# Patient Record
Sex: Female | Born: 1970
Health system: Southern US, Community
[De-identification: ages and names within clinical notes are randomized; demographics above are authoritative.]

## PROBLEM LIST (undated history)

## (undated) DIAGNOSIS — I7300 Raynaud's syndrome without gangrene: Secondary | ICD-10-CM

## (undated) DIAGNOSIS — M5010 Cervical disc disorder with radiculopathy, unspecified cervical region: Secondary | ICD-10-CM

## (undated) DIAGNOSIS — F988 Other specified behavioral and emotional disorders with onset usually occurring in childhood and adolescence: Secondary | ICD-10-CM

## (undated) DIAGNOSIS — F32A Depression, unspecified: Secondary | ICD-10-CM

## (undated) DIAGNOSIS — R002 Palpitations: Secondary | ICD-10-CM

## (undated) DIAGNOSIS — F329 Major depressive disorder, single episode, unspecified: Secondary | ICD-10-CM

## (undated) HISTORY — DX: Other specified behavioral and emotional disorders with onset usually occurring in childhood and adolescence: F98.8

## (undated) HISTORY — DX: Raynaud's syndrome without gangrene: I73.00

## (undated) HISTORY — PX: AUGMENTATION MAMMAPLASTY: SUR837

---

## 2005-11-11 ENCOUNTER — Observation Stay: Payer: Self-pay | Admitting: Obstetrics and Gynecology

## 2005-12-08 ENCOUNTER — Inpatient Hospital Stay: Payer: Self-pay | Admitting: Obstetrics and Gynecology

## 2007-05-23 ENCOUNTER — Ambulatory Visit: Payer: Self-pay | Admitting: Obstetrics and Gynecology

## 2007-07-02 ENCOUNTER — Ambulatory Visit: Payer: Self-pay | Admitting: Obstetrics and Gynecology

## 2007-07-18 ENCOUNTER — Ambulatory Visit: Payer: Self-pay | Admitting: Unknown Physician Specialty

## 2011-07-26 ENCOUNTER — Ambulatory Visit (INDEPENDENT_AMBULATORY_CARE_PROVIDER_SITE_OTHER): Payer: 59 | Admitting: Internal Medicine

## 2011-07-26 ENCOUNTER — Encounter: Payer: Self-pay | Admitting: Internal Medicine

## 2011-07-26 DIAGNOSIS — G589 Mononeuropathy, unspecified: Secondary | ICD-10-CM

## 2011-07-26 DIAGNOSIS — R5383 Other fatigue: Secondary | ICD-10-CM

## 2011-07-26 DIAGNOSIS — Z13220 Encounter for screening for lipoid disorders: Secondary | ICD-10-CM

## 2011-07-26 DIAGNOSIS — G629 Polyneuropathy, unspecified: Secondary | ICD-10-CM

## 2011-07-26 DIAGNOSIS — I7300 Raynaud's syndrome without gangrene: Secondary | ICD-10-CM

## 2011-07-26 DIAGNOSIS — R5381 Other malaise: Secondary | ICD-10-CM

## 2011-07-26 NOTE — Progress Notes (Signed)
  Subjective:    Patient ID: Kristi Spence, female    DOB: 12/23/1970, 40 y.o.   MRN: [id number]  HPI Ms. Houchen is a 40 yr old white female who was referred by Donette Larry for primary care.  Her GYN care is up to date by Annamarie Major.  Her cc is a 2 yr  history of sudden changes in color ofher fingertips and toes , which turn white in cold weather .  She notes it more often when she enters the grocery store or opens the refrigerator door.  No history of cyanotic color changes.  She has no history of dysphagia, arthralgias, skin disorders, spontaneous abortions, or DVT/VTE.  Physically very active, works out 5 to 6 days per week. 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  No past medical history on file. No current outpatient prescriptions on file prior to visit.     Review of Systems  Constitutional: Negative for fever, chills and unexpected weight change.  HENT: Negative for hearing loss, ear pain, nosebleeds, congestion, sore throat, facial swelling, rhinorrhea, sneezing, mouth sores, trouble swallowing, neck pain, neck stiffness, voice change, postnasal drip, sinus pressure, tinnitus and ear discharge.   Eyes: Negative for pain, discharge, redness and visual disturbance.  Respiratory: Negative for cough, chest tightness, shortness of breath, wheezing and stridor.   Cardiovascular: Negative for chest pain, palpitations and leg swelling.  Musculoskeletal: Negative for myalgias and arthralgias.  Skin: Negative for color change and rash.  Neurological: Negative for dizziness, weakness, light-headedness and headaches.  Hematological: Negative for adenopathy.       Objective:   Physical Exam  Constitutional: She is oriented to person, place, and time. She appears well-developed and well-nourished.  HENT:  Mouth/Throat: Oropharynx is clear and moist.  Eyes: EOM are normal. Pupils are equal, round, and reactive to light. No scleral icterus.  Neck: Normal range of motion.  Neck supple. No JVD present. No thyromegaly present.  Cardiovascular: Normal rate, regular rhythm, normal heart sounds and intact distal pulses.        Cap refill 3 secs on toes.  Pulses intact  Pulmonary/Chest: Effort normal and breath sounds normal.  Abdominal: Soft. Bowel sounds are normal. She exhibits no mass. There is no tenderness.  Musculoskeletal: Normal range of motion. She exhibits no edema.  Lymphadenopathy:    She has no cervical adenopathy.  Neurological: She is alert and oriented to person, place, and time.  Skin: Skin is warm and dry.  Psychiatric: She has a normal mood and affect.          Assessment & Plan:   Raynaud phenomenon New diagnosis, with no associated symptoms concernign for connective tissue or autoimmune disease.  Her father and brother also have the same symptoms.  Patient information given about the syndrome,  Reassurance provided.   Screening for lipoid disorders She will return for fasting lipids.    Updated Medication List No outpatient encounter prescriptions on file as of 07/26/2011.

## 2011-07-27 ENCOUNTER — Encounter: Payer: Self-pay | Admitting: Internal Medicine

## 2011-07-27 DIAGNOSIS — Z13220 Encounter for screening for lipoid disorders: Secondary | ICD-10-CM | POA: Insufficient documentation

## 2011-07-27 NOTE — Assessment & Plan Note (Signed)
She will return for fasting lipids 

## 2011-07-27 NOTE — Assessment & Plan Note (Signed)
New diagnosis, with no associated symptoms concernign for connective tissue or autoimmune disease.  Her father and brother also have the same symptoms.  Patient information given about the syndrome,  Reassurance provided.

## 2011-07-28 ENCOUNTER — Other Ambulatory Visit (INDEPENDENT_AMBULATORY_CARE_PROVIDER_SITE_OTHER): Payer: 59 | Admitting: *Deleted

## 2011-07-28 DIAGNOSIS — R5381 Other malaise: Secondary | ICD-10-CM

## 2011-07-28 DIAGNOSIS — G589 Mononeuropathy, unspecified: Secondary | ICD-10-CM

## 2011-07-28 DIAGNOSIS — G629 Polyneuropathy, unspecified: Secondary | ICD-10-CM

## 2011-07-28 DIAGNOSIS — Z13220 Encounter for screening for lipoid disorders: Secondary | ICD-10-CM

## 2011-07-28 DIAGNOSIS — R5383 Other fatigue: Secondary | ICD-10-CM

## 2011-07-28 LAB — LIPID PANEL
Cholesterol: 180 mg/dL (ref 0–200)
HDL: 68.6 mg/dL (ref >39.00)
LDL Cholesterol: 103 mg/dL — ABNORMAL HIGH (ref 0–99)
Triglycerides: 40 mg/dL (ref 0.0–149.0)
VLDL: 8 mg/dL (ref 0.0–40.0)

## 2011-07-28 LAB — COMPREHENSIVE METABOLIC PANEL
AST: 34 U/L (ref 0–37)
BUN: 12 mg/dL (ref 6–23)
Calcium: 8.8 mg/dL (ref 8.4–10.5)
Chloride: 105 mEq/L (ref 96–112)
Creatinine, Ser: 0.8 mg/dL (ref 0.4–1.2)

## 2011-07-28 LAB — CBC WITH DIFFERENTIAL/PLATELET
Eosinophils Relative: 1.8 % (ref 0.0–5.0)
HCT: 40 % (ref 36.0–46.0)
Lymphs Abs: 1.7 10*3/uL (ref 0.7–4.0)
Monocytes Relative: 12.4 % — ABNORMAL HIGH (ref 3.0–12.0)
Neutrophils Relative %: 46.3 % (ref 43.0–77.0)
Platelets: 160 10*3/uL (ref 150.0–400.0)
WBC: 4.3 10*3/uL — ABNORMAL LOW (ref 4.5–10.5)

## 2011-07-28 LAB — TSH: TSH: 0.65 u[IU]/mL (ref 0.35–5.50)

## 2011-08-02 ENCOUNTER — Other Ambulatory Visit (INDEPENDENT_AMBULATORY_CARE_PROVIDER_SITE_OTHER): Payer: 59 | Admitting: *Deleted

## 2011-08-02 DIAGNOSIS — E538 Deficiency of other specified B group vitamins: Secondary | ICD-10-CM

## 2011-08-02 LAB — VITAMIN B12: Vitamin B-12: 467 pg/mL (ref 211–911)

## 2011-08-04 ENCOUNTER — Other Ambulatory Visit: Payer: Self-pay | Admitting: Internal Medicine

## 2011-08-04 NOTE — Progress Notes (Signed)
Addended by: Siobhan Zaro L on: 08/04/2011 10:28 AM   Modules accepted: Orders  

## 2011-08-04 NOTE — Progress Notes (Signed)
Addended by: Jobie Quaker on: 08/04/2011 10:28 AM   Modules accepted: Orders

## 2011-08-07 LAB — FOLATE RBC: RBC Folate: 780 ng/mL (ref >=366)

## 2011-08-29 DIAGNOSIS — I7300 Raynaud's syndrome without gangrene: Secondary | ICD-10-CM

## 2011-08-29 HISTORY — DX: Raynaud's syndrome without gangrene: I73.00

## 2012-06-12 ENCOUNTER — Telehealth: Payer: Self-pay | Admitting: Internal Medicine

## 2012-06-12 NOTE — Telephone Encounter (Signed)
[employer] school health exam certificate form in dr Darrick Huntsman box

## 2012-06-18 ENCOUNTER — Telehealth: Payer: Self-pay | Admitting: Internal Medicine

## 2012-06-18 ENCOUNTER — Ambulatory Visit (INDEPENDENT_AMBULATORY_CARE_PROVIDER_SITE_OTHER): Payer: 59 | Admitting: Internal Medicine

## 2012-06-18 DIAGNOSIS — Z111 Encounter for screening for respiratory tuberculosis: Secondary | ICD-10-CM

## 2012-06-18 DIAGNOSIS — Z23 Encounter for immunization: Secondary | ICD-10-CM

## 2012-06-18 NOTE — Telephone Encounter (Signed)
Pt is calling about form that was dropped off she is needing by today.

## 2012-06-18 NOTE — Telephone Encounter (Signed)
Spoke to patient today she had her TB skin test done and she will have it read on 06/20/12

## 2012-06-19 NOTE — Progress Notes (Signed)
Patient ID: Kristi Spence, female   DOB: December 12, 1970, 41 y.o.   MRN: [id number] Patient is here to have PPD placed and to receive vaccinations.

## 2012-06-20 ENCOUNTER — Telehealth: Payer: Self-pay | Admitting: Internal Medicine

## 2012-06-20 LAB — TB SKIN TEST: TB Skin Test: NEGATIVE

## 2012-06-20 NOTE — Telephone Encounter (Signed)
Pt was calling back to see if her results were in for her TB Skin Test ???

## 2012-06-20 NOTE — Telephone Encounter (Signed)
Patient was told on Tuesday when she had the TB skin test done to come in on Thursday to have it read.

## 2012-08-14 ENCOUNTER — Ambulatory Visit: Payer: Self-pay | Admitting: Obstetrics and Gynecology

## 2012-10-01 ENCOUNTER — Ambulatory Visit: Payer: Self-pay | Admitting: Obstetrics and Gynecology

## 2012-10-01 LAB — URINALYSIS, COMPLETE
Bilirubin,UR: NEGATIVE
Blood: NEGATIVE
Glucose,UR: NEGATIVE mg/dL (ref 0–75)
Ketone: NEGATIVE
Leukocyte Esterase: NEGATIVE
Nitrite: NEGATIVE
Ph: 5 (ref 4.5–8.0)
RBC,UR: <1 /HPF (ref 0–5)
Specific Gravity: 1.005 (ref 1.003–1.030)
Squamous Epithelial: 4
WBC UR: NONE SEEN /HPF (ref 0–5)

## 2012-10-01 LAB — HEMOGLOBIN: HGB: 14.3 g/dL (ref 12.0–16.0)

## 2012-10-11 ENCOUNTER — Ambulatory Visit: Payer: Self-pay | Admitting: Specialist

## 2013-06-01 LAB — HM PAP SMEAR: HM PAP: NORMAL

## 2014-03-17 ENCOUNTER — Ambulatory Visit: Payer: 59 | Admitting: Internal Medicine

## 2014-03-20 ENCOUNTER — Ambulatory Visit: Payer: 59 | Admitting: Internal Medicine

## 2014-04-01 ENCOUNTER — Encounter: Payer: Self-pay | Admitting: Internal Medicine

## 2014-04-01 ENCOUNTER — Encounter: Payer: Self-pay | Admitting: *Deleted

## 2014-04-01 ENCOUNTER — Ambulatory Visit (INDEPENDENT_AMBULATORY_CARE_PROVIDER_SITE_OTHER): Payer: BC Managed Care – PPO | Admitting: Internal Medicine

## 2014-04-01 VITALS — BP 104/68 | HR 77 | Temp 98.3°F | Resp 14 | Ht 64.5 in | Wt 117.5 lb

## 2014-04-01 DIAGNOSIS — K5909 Other constipation: Secondary | ICD-10-CM

## 2014-04-01 DIAGNOSIS — F988 Other specified behavioral and emotional disorders with onset usually occurring in childhood and adolescence: Secondary | ICD-10-CM

## 2014-04-01 DIAGNOSIS — K5900 Constipation, unspecified: Secondary | ICD-10-CM

## 2014-04-01 DIAGNOSIS — K589 Irritable bowel syndrome without diarrhea: Secondary | ICD-10-CM

## 2014-04-01 DIAGNOSIS — F909 Attention-deficit hyperactivity disorder, unspecified type: Secondary | ICD-10-CM

## 2014-04-01 DIAGNOSIS — R6882 Decreased libido: Secondary | ICD-10-CM

## 2014-04-01 DIAGNOSIS — F900 Attention-deficit hyperactivity disorder, predominantly inattentive type: Secondary | ICD-10-CM

## 2014-04-01 DIAGNOSIS — D72819 Decreased white blood cell count, unspecified: Secondary | ICD-10-CM

## 2014-04-01 LAB — CBC WITH DIFFERENTIAL/PLATELET
Basophils Absolute: 0 10*3/uL (ref 0.0–0.1)
Basophils Relative: 0.7 % (ref 0.0–3.0)
EOS PCT: 2.9 % (ref 0.0–5.0)
Eosinophils Absolute: 0.2 10*3/uL (ref 0.0–0.7)
HEMATOCRIT: 43.6 % (ref 36.0–46.0)
Hemoglobin: 14.4 g/dL (ref 12.0–15.0)
LYMPHS ABS: 2 10*3/uL (ref 0.7–4.0)
Lymphocytes Relative: 36.7 % (ref 12.0–46.0)
MCHC: 33 g/dL (ref 30.0–36.0)
MCV: 102.7 fl — ABNORMAL HIGH (ref 78.0–100.0)
Monocytes Absolute: 0.5 10*3/uL (ref 0.1–1.0)
Monocytes Relative: 9.7 % (ref 3.0–12.0)
Neutro Abs: 2.7 10*3/uL (ref 1.4–7.7)
Neutrophils Relative %: 50 % (ref 43.0–77.0)
Platelets: 213 10*3/uL (ref 150.0–400.0)
RBC: 4.25 Mil/uL (ref 3.87–5.11)
RDW: 13.8 % (ref 11.5–15.5)
WBC: 5.3 10*3/uL (ref 4.0–10.5)

## 2014-04-01 LAB — COMPREHENSIVE METABOLIC PANEL
ALK PHOS: 47 U/L (ref 39–117)
ALT: 26 U/L (ref 0–35)
AST: 29 U/L (ref 0–37)
Albumin: 4.2 g/dL (ref 3.5–5.2)
BILIRUBIN TOTAL: 0.7 mg/dL (ref 0.2–1.2)
BUN: 14 mg/dL (ref 6–23)
CO2: 24 mEq/L (ref 19–32)
CREATININE: 0.8 mg/dL (ref 0.4–1.2)
Calcium: 9.3 mg/dL (ref 8.4–10.5)
Chloride: 102 mEq/L (ref 96–112)
GFR: 78.47 mL/min (ref >60.00)
GLUCOSE: 82 mg/dL (ref 70–99)
Potassium: 4.6 mEq/L (ref 3.5–5.1)
SODIUM: 136 meq/L (ref 135–145)
TOTAL PROTEIN: 6.8 g/dL (ref 6.0–8.3)

## 2014-04-01 LAB — TSH: TSH: 0.43 u[IU]/mL (ref 0.35–4.50)

## 2014-04-01 LAB — MAGNESIUM: Magnesium: 1.9 mg/dL (ref 1.5–2.5)

## 2014-04-01 MED ORDER — METHYLPHENIDATE HCL 5 MG PO TABS: 5.0000 mg | ORAL_TABLET | Freq: Every day | ORAL | Status: DC

## 2014-04-01 MED ORDER — METHYLPHENIDATE HCL 5 MG PO TABS
5.0000 mg | ORAL_TABLET | Freq: Every day | ORAL | Status: DC
Start: 2014-04-01 — End: 2014-04-01

## 2014-04-01 NOTE — Patient Instructions (Signed)
Referral to [facility] for cognitive testing    Irritable Bowel Syndrome Irritable bowel syndrome (IBS) is caused by a disturbance of normal bowel function and is a common digestive disorder. You may also hear this condition called spastic colon, mucous colitis, and irritable colon. There is no cure for IBS. However, symptoms often gradually improve or disappear with a good diet, stress management, and medicine. This condition usually appears in late adolescence or early adulthood. Women develop it twice as often as men. CAUSES  After food has been digested and absorbed in the small intestine, waste material is moved into the large intestine, or colon. In the colon, water and salts are absorbed from the undigested products coming from the small intestine. The remaining residue, or fecal material, is held for elimination. Under normal circumstances, gentle, rhythmic contractions of the bowel walls push the fecal material along the colon toward the rectum. In IBS, however, these contractions are irregular and poorly coordinated. The fecal material is either retained too long, resulting in constipation, or expelled too soon, producing diarrhea. SIGNS AND SYMPTOMS  The most common symptom of IBS is abdominal pain. It is often in the lower left side of the abdomen, but it may occur anywhere in the abdomen. The pain comes from spasms of the bowel muscles happening too much and from the buildup of gas and fecal material in the colon. This pain:  Can range from sharp abdominal cramps to a dull, continuous ache.  Often worsens soon after eating.  Is often relieved by having a bowel movement or passing gas. Abdominal pain is usually accompanied by constipation, but it may also produce diarrhea. The diarrhea often occurs right after a meal or upon waking up in the morning. The stools are often soft, watery, and flecked with mucus. Other symptoms of IBS include:  Bloating.  Loss of  appetite.  Heartburn.  Backache.  Dull pain in the arms or shoulders.  Nausea.  Burping.  Vomiting.  Gas. IBS may also cause symptoms that are unrelated to the digestive system, such as:  Fatigue.  Headaches.  Anxiety.  Shortness of breath.  Trouble concentrating.  Dizziness. These symptoms tend to come and go. DIAGNOSIS  The symptoms of IBS may seem like symptoms of other, more serious digestive disorders. Your health care provider may want to perform tests to exclude these disorders.  TREATMENT Many medicines are available to help correct bowel function or relieve bowel spasms and abdominal pain. Among the medicines available are:  Laxatives for severe constipation and to help restore normal bowel habits.  Specific antidiarrheal medicines to treat severe or lasting diarrhea.  Antispasmodic agents to relieve intestinal cramps. Your health care provider may also decide to treat you with a mild tranquilizer or sedative during unusually stressful periods in your life. Your health care provider may also prescribe antidepressant medicine. The use of this medicine has been shown to reduce pain and other symptoms of IBS. Remember that if any medicine is prescribed for you, you should take it exactly as directed. Make sure your health care provider knows how well it worked for you. HOME CARE INSTRUCTIONS   Take all medicines as directed by your health care provider.  Avoid foods that are high in fat or oils, such as heavy cream, butter, frankfurters, sausage, and other fatty meats.  Avoid foods that make you go to the bathroom, such as fruit, fruit juice, and dairy products.  Cut out carbonated drinks, chewing gum, and "gassy" foods such as  beans and cabbage. This may help relieve bloating and burping.  Eat foods with bran, and drink plenty of liquids with the bran foods. This helps relieve constipation.  Keep track of what foods seem to bring on your symptoms.  Avoid  emotionally charged situations or circumstances that produce anxiety.  Start or continue exercising.  Get plenty of rest and sleep. Document Released: 08/14/2005 Document Revised: 08/19/2013 Document Reviewed: 04/03/2008 [facility] Patient Information 2015 [place], [place]. This information is not intended to replace advice given to you by your health care provider. Make sure you discuss any questions you have with your health care provider.

## 2014-04-01 NOTE — Progress Notes (Addendum)
Patient ID: Kristi Spence, female   DOB: 04-24-1971, 43 y.o.   MRN: [id number]  Patient Active Problem List   Diagnosis Date Noted  . ADD (attention deficit hyperactivity disorder, inattentive type) 04/03/2014  . Loss of libido 04/03/2014  . IBS (irritable bowel syndrome) 04/03/2014  . Screening for lipoid disorders 07/27/2011  . Raynaud phenomenon 07/26/2011    Subjective:  CC:   Chief Complaint  Patient presents with  . Follow-up    for medication refills and questions concerning aging.    HPI:   Kristi Spence is a 43 y.o. female who presents for  Follow up on several issues    1) She has a history of ADHD controlled with low dose of ritalin previously prescribed by her former gynecologist, Dr Patty Sermons who has moved away,    History : she was tested at age 61 due to long history of poor performance in school due to trouble concentrating and finishing tasks.  She performed poorly in high school as well as in  college.  She was identified as being an Optician, dispensing with difficulty in memory retention. Marland Kitchen  She was previously prescribed metidate CD which made her very anxious but has tolerated 5 mg ritalin daily prn for years.    She continues to have difficulty performing simple mental calculations including  making change. She is concerned that her memory deficits are progressing and is requesting further evaluation.  Discussed seeing a  neurologist at [place]   2) Loss of libido for the past 5 years, She is happy in her marriage ,  But her husband has reduced libido as well, complicated by ongoing alcohol abuse and always feeling stressed out.  Patient has no desire,  No pain with sex.   Prior trial of wellbutrin  Made her fidgety and she did not want to continue it.   3) Bloating, gas pains and constipation her entire life. Has changed diet to more of the widely popular "Paleo diet," and increased her water intake which has helped with stooling, energy, bloating and sleep.  Her stool changes daily from large caliber to narrow .   Has a bm every day to every other day.  Limits use of laxatives to vegetable laxative if she does not stool for 3 days.   Used to get an uncomfortable sensation around her epigastric area that feels painfula nd "fluttery"  That has resolved since she started the diet   History reviewed. No pertinent past medical history.  Past Surgical History  Procedure Laterality Date  . Cesarean section         The following portions of the patient's history were reviewed and updated as appropriate: Allergies, current medications, and problem list.    Review of Systems:   Patient denies headache, fevers, malaise, unintentional weight loss, skin rash, eye pain, sinus congestion and sinus pain, sore throat, dysphagia,  hemoptysis , cough, dyspnea, wheezing, chest pain, palpitations, orthopnea, edema, abdominal pain, nausea, melena, diarrhea, constipation, flank pain, dysuria, hematuria, urinary  Frequency, nocturia, numbness, tingling, seizures,  Focal weakness, Loss of consciousness,  Tremor, insomnia, depression, anxiety, and suicidal ideation.     History   Social History  . Marital Status: Married    Spouse Name: N/A    Number of Children: N/A  . Years of Education: N/A   Occupational History  . Not on file.   Social History Main Topics  . Smoking status: Never Smoker   . Smokeless tobacco: Never Used  .  Alcohol Use: 2.5 oz/week    5 drink(s) per week  . Drug Use: No  . Sexual Activity: Yes    Birth Control/ Protection: Condom   Other Topics Concern  . Not on file   Social History Narrative  . No narrative on file    Objective:  Filed Vitals:   04/01/14 0907  BP: 104/68  Pulse: 77  Temp: 98.3 F (36.8 C)  Resp: 14     General appearance: alert, cooperative and appears stated age Ears: normal TM's and external ear canals both ears Throat: lips, mucosa, and tongue normal; teeth and gums normal Neck: no  adenopathy, no carotid bruit, supple, symmetrical, trachea midline and thyroid not enlarged, symmetric, no tenderness/mass/nodules Back: symmetric, no curvature. ROM normal. No CVA tenderness. Lungs: clear to auscultation bilaterally Heart: regular rate and rhythm, S1, S2 normal, no murmur, click, rub or gallop Abdomen: soft, non-tender; bowel sounds normal; no masses,  no organomegaly Pulses: 2+ and symmetric Skin: Skin color, texture, turgor normal. No rashes or lesions Lymph nodes: Cervical, supraclavicular, and axillary nodes normal.  Assessment and Plan:  ADD (attention deficit hyperactivity disorder, inattentive type) Managed with age 45 with low dose ritalin.  Previously prescribed by GYN.  Refill given,  Controlled substance contract discussed and signed. Referral to [facility] for comprehensive neuocognitive testing given current concerns of cognitive decline.   Loss of libido She denies vaginal dryness/dyspareunia as a contributor.  Did not tolerate wellbutrin in prior trial of ADD.  liekly secondary to husband's dysfunction secondary to alcohol abuse.   IBS (irritable bowel syndrome) Constipation predominant.  No warning signs indicating need to colonoscopy.  Recommended an increase in fiber intake to 25 to 35 grams daily.   I recommended daily use of Miralax., metamucil, citrucel, benefiber  or fibercon.  Discussed trial of Linzess or Amitiza if no improvement seen with daily use of a fiber laxative   A total of 40 minutes was spent with patient more than half of which was spent in counseling, reviewing records from other prviders and coordination of care.  Updated Medication List Outpatient Encounter Prescriptions as of 04/01/2014  Medication Sig  . etonogestrel (IMPLANON) 68 MG IMPL implant Inject 1 each into the skin once.  . methylphenidate (RITALIN) 5 MG tablet Take 1 tablet (5 mg total) by mouth daily.  . valACYclovir (VALTREX) 500 MG tablet Take 1 tablet by mouth  daily.  Marland Kitchen zolpidem (AMBIEN) 10 MG tablet Take 10 mg by mouth at bedtime as needed.  . [DISCONTINUED] methylphenidate (RITALIN) 5 MG tablet Take 1 tablet by mouth daily.  . [DISCONTINUED] methylphenidate (RITALIN) 5 MG tablet Take 1 tablet (5 mg total) by mouth daily.  . [DISCONTINUED] methylphenidate (RITALIN) 5 MG tablet Take 1 tablet (5 mg total) by mouth daily.     Orders Placed This Encounter  Procedures  . Fecal occult blood, imunochemical  . HM PAP SMEAR  . CBC with Differential  . Comprehensive metabolic panel  . TSH  . Magnesium  . Ambulatory referral to Neurology    No Follow-up on file.

## 2014-04-01 NOTE — Progress Notes (Signed)
Pre-visit discussion using our clinic review tool. No additional management support is needed unless otherwise documented below in the visit note.  

## 2014-04-02 ENCOUNTER — Other Ambulatory Visit (INDEPENDENT_AMBULATORY_CARE_PROVIDER_SITE_OTHER): Payer: BC Managed Care – PPO

## 2014-04-02 ENCOUNTER — Encounter: Payer: Self-pay | Admitting: *Deleted

## 2014-04-02 DIAGNOSIS — K5900 Constipation, unspecified: Secondary | ICD-10-CM

## 2014-04-02 DIAGNOSIS — K5909 Other constipation: Secondary | ICD-10-CM

## 2014-04-03 ENCOUNTER — Encounter: Payer: Self-pay | Admitting: Internal Medicine

## 2014-04-03 DIAGNOSIS — F900 Attention-deficit hyperactivity disorder, predominantly inattentive type: Secondary | ICD-10-CM | POA: Insufficient documentation

## 2014-04-03 DIAGNOSIS — R6882 Decreased libido: Secondary | ICD-10-CM | POA: Insufficient documentation

## 2014-04-03 DIAGNOSIS — K589 Irritable bowel syndrome without diarrhea: Secondary | ICD-10-CM | POA: Insufficient documentation

## 2014-04-03 LAB — FECAL OCCULT BLOOD, IMMUNOCHEMICAL: Fecal Occult Bld: NEGATIVE

## 2014-04-03 NOTE — Assessment & Plan Note (Signed)
She denies vaginal dryness/dyspareunia as a contributor.  Did not tolerate wellbutrin in prior trial of ADD.  liekly secondary to husband's dysfunction secondary to alcohol abuse.

## 2014-04-03 NOTE — Assessment & Plan Note (Addendum)
Constipation predominant.  No warning signs indicating need to colonoscopy.  Recommended an increase in fiber intake to 25 to 35 grams daily.   I recommended daily use of Miralax., metamucil, citrucel, benefiber  or fibercon.  Discussed trial of Linzess or Amitiza if no improvement seen with daily use of a fiber laxative

## 2014-04-03 NOTE — Assessment & Plan Note (Signed)
Managed with age 43 with low dose ritalin.  Previously prescribed by GYN.  Refill given,  Controlled substance contract discussed and signed. Referral to [facility] for comprehensive neuocognitive testing given current concerns of cognitive decline.

## 2014-04-07 ENCOUNTER — Encounter: Payer: Self-pay | Admitting: *Deleted

## 2014-04-28 ENCOUNTER — Encounter: Payer: Self-pay | Admitting: Internal Medicine

## 2014-06-09 ENCOUNTER — Ambulatory Visit: Payer: Self-pay | Admitting: Orthopedic Surgery

## 2014-09-10 ENCOUNTER — Telehealth: Payer: Self-pay | Admitting: Internal Medicine

## 2014-09-10 NOTE — Telephone Encounter (Signed)
[facility]     Patient Name: Kristi Spence   DOB: [birth year]      Nurse Assessment  Nurse: Erlene Quan, RN, Manuela Schwartz Date/Time Eilene Ghazi Time): 09/10/2014 8:33:43 AM  Confirm and document reason for call. If symptomatic, describe symptoms. ---Caller states she has anxiety, mother has passed away, wants to know if there is something she can take during the day. Is out of town - she has not had a panic attack but having feelings of panic and her husband advised her she needed to call and see if MD will call her something in to get through next few days - she called the office an Dr Derrel Nip is out of town - she has never seen any other physician in this practice   Has the patient traveled out of the country within the last 30 days? ---Not Applicable   Does the patient require triage? ---No   Please document clinical information provided and list any resource used. ---spoke with Hoyle Sauer at the office - she states that new medications are not called in without patient being seen - and anti anxiety medications are not called in - spoke back with Quintasha advised her we would not be able to have anything called in for her - states she understands, she was afraid it may make her to "out of it" to function anyway - she will call back if has any questions or concerns     Guidelines     Guideline Title Affirmed Question Affirmed Notes        Final Disposition User

## 2014-10-05 ENCOUNTER — Encounter (INDEPENDENT_AMBULATORY_CARE_PROVIDER_SITE_OTHER): Payer: Self-pay

## 2014-10-05 ENCOUNTER — Encounter: Payer: Self-pay | Admitting: Internal Medicine

## 2014-10-05 ENCOUNTER — Ambulatory Visit (INDEPENDENT_AMBULATORY_CARE_PROVIDER_SITE_OTHER): Payer: BLUE CROSS/BLUE SHIELD | Admitting: Internal Medicine

## 2014-10-05 VITALS — BP 98/60 | HR 92 | Temp 98.5°F | Resp 14 | Ht 65.0 in | Wt 118.8 lb

## 2014-10-05 DIAGNOSIS — Z7189 Other specified counseling: Secondary | ICD-10-CM

## 2014-10-05 DIAGNOSIS — F988 Other specified behavioral and emotional disorders with onset usually occurring in childhood and adolescence: Secondary | ICD-10-CM

## 2014-10-05 DIAGNOSIS — F4321 Adjustment disorder with depressed mood: Secondary | ICD-10-CM

## 2014-10-05 DIAGNOSIS — F900 Attention-deficit hyperactivity disorder, predominantly inattentive type: Secondary | ICD-10-CM

## 2014-10-05 DIAGNOSIS — F909 Attention-deficit hyperactivity disorder, unspecified type: Secondary | ICD-10-CM

## 2014-10-05 DIAGNOSIS — I7300 Raynaud's syndrome without gangrene: Secondary | ICD-10-CM

## 2014-10-05 NOTE — Progress Notes (Signed)
Pre-visit discussion using our clinic review tool. No additional management support is needed unless otherwise documented below in the visit note.  

## 2014-10-05 NOTE — Patient Instructions (Signed)
I will maek a referral to Hospice for grieNifedipine capsules What is this medicine? NIFEDIPINE (nye FED i peen) is a calcium-channel blocker. It affects the amount of calcium found in your heart and muscle cells. This relaxes your blood vessels, which can reduce the amount of work the heart has to do. This medicine is used to treat chest pain caused by angina. This medicine may be used for other purposes; ask your health care provider or pharmacist if you have questions. COMMON BRAND NAME(S): Adalat, Procardia What should I tell my health care provider before I take this medicine? They need to know if you have any of these conditions: -heart problems, low blood pressure, slow or irregular heartbeat -kidney disease -liver disease -previous heart attack -an unusual or allergic reaction to nifedipine, other medicines, foods, dyes, or preservatives -pregnant or trying to get pregnant -breast-feeding How should I use this medicine? Take this medicine by mouth with a glass of water. Follow the directions on the prescription label. Swallow whole. Take your doses at regular intervals. Do not take your medicine more often then directed. Do not suddenly stop taking this medicine. Your doctor will tell you how much medicine to take. If your doctor wants you to stop the medicine, the dose will be slowly lowered over time to avoid any side effects. Talk to your pediatrician regarding the use of this medicine in children. Special care may be needed. Overdosage: If you think you have taken too much of this medicine contact a poison control center or emergency room at once. NOTE: This medicine is only for you. Do not share this medicine with others. What if I miss a dose? If you miss a dose, take it as soon as you can. If it is almost time for your next dose, take only that dose. Do not take double or extra doses. What may interact with this medicine? Do not take this medicine with any of the following  medications: -certain medicines for seizures like carbamazepine, phenobarbital, phenytoin -rifabutin -rifampin -rifapentine -St. John's Wort This medicine may also interact with the following medications: -antiviral medicines for HIV or AIDS -certain medicines for blood pressure -certain medicines for diabetes -certain medicines for erectile dysfunction -certain medicines for fungal infections like ketoconazole, fluconazole, and itraconazole -certain medicines for irregular heart beat like flecainide and quinidine -certain medicines that treat or prevent blood clots like warfarin -clarithromycin -digoxin -dolasetron -erythromycin -fluoxetine -grapefruit juice -local or general anesthetics -nefazodone -orlistat -quinupristin; dalfopristin -sirolimus -stomach acid blockers like cimetidine, ranitidine, omeprazole, or pantoprazole -tacrolimus -valproic acid This list may not describe all possible interactions. Give your health care provider a list of all the medicines, herbs, non-prescription drugs, or dietary supplements you use. Also tell them if you smoke, drink alcohol, or use illegal drugs. Some items may interact with your medicine. What should I watch for while using this medicine? Visit your doctor or health care professional for regular check ups. Check your blood pressure and pulse rate regularly. Ask your doctor or health care professional what your blood pressure and pulse rate should be and when you should contact him or her. You may get drowsy or dizzy. Do not drive, use machinery, or do anything that needs mental alertness until you know how this medicine affects you. Do not stand or sit up quickly, especially if you are an older patient. This reduces the risk of dizzy or fainting spells. Alcohol may interfere with the effect of this medicine. Avoid alcoholic drinks. What side effects may  I notice from receiving this medicine? Side effects that you should report to your  doctor or health care professional as soon as possible: -blood in the urine -difficulty breathing -fast heartbeat, palpitations, irregular heartbeat, chest pain -redness, blistering, peeling or loosening of the skin, including inside the mouth -reduced amount of urine passed -skin rash -swelling of the legs and ankles Side effects that usually do not require medical attention (report to your doctor or health care professional if they continue or are bothersome): -constipation -facial flushing -headache -weakness or tiredness This list may not describe all possible side effects. Call your doctor for medical advice about side effects. You may report side effects to FDA at 1-800-FDA-1088. Where should I keep my medicine? Keep out of the reach of children. Store at room temperature between 15 and 25 degrees C (59 and 77 degrees F). Protect from light and moisture. Keep container tightly closed. Throw away any unused medicine after the expiration date. NOTE: This sheet is a summary. It may not cover all possible information. If you have questions about this medicine, talk to your doctor, pharmacist, or health care provider.  2015, [employer]. (2008-11-04 13:09:21) f counselling  You are welcome to try the Procardia .  If it drops your BP to < 90,  However,  Stop taking it

## 2014-10-05 NOTE — Progress Notes (Signed)
Patient ID: Kristi Spence, female   DOB: 14-Nov-1970, 44 y.o.   MRN: [id number]  Patient Active Problem List   Diagnosis Date Noted  . Grief reaction 10/06/2014  . ADD (attention deficit hyperactivity disorder, inattentive type) 04/03/2014  . Loss of libido 04/03/2014  . IBS (irritable bowel syndrome) 04/03/2014  . Screening for lipoid disorders 07/27/2011  . Raynaud phenomenon 07/26/2011    Subjective:  CC:   Chief Complaint  Patient presents with  . Follow-up    Medication, To discuss medication prescribed by dermatologistalso to discuss precaution for cancer her mother passed from  a month ago.Marland Kitchen    HPI:   Kristi Spence is a 44 y.o. female who presents for follow up on multiple issues including ADD,  renaud's and grief.  Grief .  Patient is mourning the sudden and  unexpected  loss of her previously healthy  mother who died in 10/18/22 just weeks after being diagnosed with an aggressive metastatic squamous cell Cancer  of unknown primary.  The cancer was found incidentally during a workup for back pain.  Within weeks of diagnosis and after receiving one treatment of chemotherapy in [place] she developed respiratory distress and died .  Kristi Spence is having a difficult time adjusting to the loss of her mother but is sleeping. She lost 5-6 lbs initially but has started tregain it and her appetite has returned.   No autopsy was done, and she is wondering if she is at risk as well , since her mother was so healthy, had not smoked in over 40 years (and then , only in college ) and ate organic food. Mother had a history of hysterectomy and the pathology reports were reviewed again per patient to rule out a missed cancer.   Follow up on ADD;  She is taking her medication as prescribed,  Without adverse effects.  She notes an improvement in focus and concentration and ability to complete tasks, but is disappointed that the medication has not helped her to understand and assimilate  complex information. Her neurology referral has been postponed due to her travelling to [place] to be with her mother , who has now passed.     Renaud's phenomenon:  She has been experiencing blanching of fingers and toes with cold weather and trips to the refrigerated section of the grocery store.  She has tried putting handwarmers in her gloves but this makes gripping her ski poles more difficult .  Dr Evorn Gong has prescribed Procardia,  But she has requested my opinion before initiating a trial of medication.    No past medical history on file.  Past Surgical History  Procedure Laterality Date  . Cesarean section         The following portions of the patient's history were reviewed and updated as appropriate: Allergies, current medications, and problem list.    Review of Systems:   Patient denies headache, fevers, malaise, unintentional weight loss, skin rash, eye pain, sinus congestion and sinus pain, sore throat, dysphagia,  hemoptysis , cough, dyspnea, wheezing, chest pain, palpitations, orthopnea, edema, abdominal pain, nausea, melena, diarrhea, constipation, flank pain, dysuria, hematuria, urinary  Frequency, nocturia, numbness, tingling, seizures,  Focal weakness, Loss of consciousness,  Tremor, insomnia, depression, anxiety, and suicidal ideation.     History   Social History  . Marital Status: Married    Spouse Name: N/A    Number of Children: N/A  . Years of Education: N/A   Occupational History  . Not on file.  Social History Main Topics  . Smoking status: Never Smoker   . Smokeless tobacco: Never Used  . Alcohol Use: 2.5 oz/week    5 drink(s) per week  . Drug Use: No  . Sexual Activity: Yes    Birth Control/ Protection: Condom   Other Topics Concern  . Not on file   Social History Narrative    Objective:  Filed Vitals:   10/05/14 0906  BP: 98/60  Pulse: 92  Temp: 98.5 F (36.9 C)  Resp: 14     General appearance: alert, cooperative and  appears stated age Ears: normal TM's and external ear canals both ears Throat: lips, mucosa, and tongue normal; teeth and gums normal Neck: no adenopathy, no carotid bruit, supple, symmetrical, trachea midline and thyroid not enlarged, symmetric, no tenderness/mass/nodules Back: symmetric, no curvature. ROM normal. No CVA tenderness. Lungs: clear to auscultation bilaterally Heart: regular rate and rhythm, S1, S2 normal, no murmur, click, rub or gallop Abdomen: soft, non-tender; bowel sounds normal; no masses,  no organomegaly Pulses: 2+ and symmetric Skin: Skin color, texture, turgor normal. No rashes or lesions Lymph nodes: Cervical, supraclavicular, and axillary nodes normal.  Assessment and Plan:  Grief reaction  Given her mother's recent diagnosis and death from a particularly aggressive squamous cell CA of unknown origin.  It seems unlikely that she would be at increased risk , but I have offered her referrals for both genetic testing and Hospice for grief counseling .  She has decided to forego the genetic counseling at this time but is receptive to receiving counseling from Hospice,  Referral made.    ADD (attention deficit hyperactivity disorder, inattentive type) Managed  Since  age 61 with low dose ritalin.  Previously prescribed by GYN.  Refill given,  Referral to [facility] for comprehensive neuocognitive testing given concerns of cognitive decline was made at last visit but appt was postponed due to mother's illness.  She is free to reschedule as she sees fit.     Raynaud phenomenon Affecting fingers and toes. Discussed the risks and benefits of the medication prescribed by Dr Evorn Gong,  Procardia, and advised to to stop it if her BP dropped to < 90 .  A total of 40 minutes of face to face time was spent with patient more than half of which was spent in counseling about not only her grief management but her perceived increased  risk of cancer and her anticipated trial of  procardia .  Updated Medication List Outpatient Encounter Prescriptions as of 10/05/2014  Medication Sig  . etonogestrel (IMPLANON) 68 MG IMPL implant Inject 1 each into the skin once.  . methylphenidate (RITALIN) 5 MG tablet Take 1 tablet (5 mg total) by mouth daily.  . valACYclovir (VALTREX) 500 MG tablet Take 1 tablet by mouth daily.  Marland Kitchen zolpidem (AMBIEN) 10 MG tablet Take 10 mg by mouth at bedtime as needed.  Marland Kitchen NIFEdipine (PROCARDIA) 10 MG capsule Take 1 capsule by mouth daily.     Orders Placed This Encounter  Procedures  . Ambulatory referral to Hospice    Return in about 6 months (around 04/05/2015).

## 2014-10-06 ENCOUNTER — Encounter: Payer: Self-pay | Admitting: Internal Medicine

## 2014-10-06 DIAGNOSIS — F4320 Adjustment disorder, unspecified: Secondary | ICD-10-CM | POA: Insufficient documentation

## 2014-10-06 DIAGNOSIS — F4321 Adjustment disorder with depressed mood: Secondary | ICD-10-CM | POA: Insufficient documentation

## 2014-10-06 NOTE — Assessment & Plan Note (Addendum)
Given her mother's recent diagnosis and death from a particularly aggressive squamous cell CA of unknown origin.  It seems unlikely that she would be at increased risk , but I have offered her referrals for both genetic testing and Hospice for grief counseling .  She has decided to forego the genetic counseling at this time but is receptive to receiving counseling from Hospice,  Referral made.

## 2014-10-06 NOTE — Assessment & Plan Note (Signed)
Affecting fingers and toes. Discussed the risks and benefits of the medication prescribed by Dr Evorn Gong,  Procardia, and advised to to stop it if her BP dropped to < 90 .

## 2014-10-06 NOTE — Assessment & Plan Note (Signed)
Managed  Since  age 44 with low dose ritalin.  Previously prescribed by GYN.  Refill given,  Referral to [facility] for comprehensive neuocognitive testing given concerns of cognitive decline was made at last visit but appt was postponed due to mother's illness.  She is free to reschedule as she sees fit.

## 2014-10-28 ENCOUNTER — Ambulatory Visit: Payer: Self-pay

## 2014-11-09 ENCOUNTER — Ambulatory Visit: Payer: Self-pay

## 2014-11-11 ENCOUNTER — Ambulatory Visit (INDEPENDENT_AMBULATORY_CARE_PROVIDER_SITE_OTHER): Payer: BLUE CROSS/BLUE SHIELD | Admitting: General Surgery

## 2014-11-11 ENCOUNTER — Encounter: Payer: Self-pay | Admitting: General Surgery

## 2014-11-11 ENCOUNTER — Other Ambulatory Visit: Payer: BLUE CROSS/BLUE SHIELD

## 2014-11-11 VITALS — BP 102/58 | HR 77 | Resp 13 | Ht 64.0 in | Wt 119.0 lb

## 2014-11-11 DIAGNOSIS — N6320 Unspecified lump in the left breast, unspecified quadrant: Secondary | ICD-10-CM

## 2014-11-11 DIAGNOSIS — N63 Unspecified lump in breast: Secondary | ICD-10-CM

## 2014-11-11 HISTORY — PX: BREAST BIOPSY: SHX20

## 2014-11-11 NOTE — Patient Instructions (Signed)

## 2014-11-11 NOTE — Addendum Note (Signed)
Addended by: Robert Bellow on: 11/11/2014 08:46 PM   Modules accepted: Level of Service

## 2014-11-11 NOTE — Progress Notes (Signed)
Patient ID: Kristi Spence, female   DOB: [birth year], 43 y.o.   MRN: [id number]  Chief Complaint  Patient presents with  . Other    abnormal mammogram    HPI Kristi Spence is a 44 y.o. female here for evaluation of a Cat 4 mammogram and ultrasound done at [facility]. Multiple cysts were found and a biopsy was recommended for one area in the left breast. She does report some tenderness of the left breast while she was having the ultrasound preformed. She has a history of left breast nipple discharge about 2 years ago and fluid cultured was normal. Patient does perform regular self breast checks and gets regular mammograms done.   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  HPI  Past Medical History  Diagnosis Date  . ADD (attention deficit disorder)   . Raynaud's disease 2013    Past Surgical History  Procedure Laterality Date  . Cesarean section  02/2003, 11/2005    Family History  Problem Relation Age of Onset  . Heart disease Father   . Hypertension Father   . Cancer Maternal Grandfather   . Stroke Paternal Grandmother   . Stroke Paternal Grandfather   . Cancer Mother     squamous cell    Social History History  Substance Use Topics  . Smoking status: Never Smoker   . Smokeless tobacco: Never Used  . Alcohol Use: 3.0 oz/week    5 Standard drinks or equivalent per week    Allergies  Allergen Reactions  . Penicillins Hives  . Sulfa Antibiotics Hives  . Other Rash    Steri strips    Current Outpatient Prescriptions  Medication Sig Dispense Refill  . etonogestrel (IMPLANON) 68 MG IMPL implant Inject 1 each into the skin once.    . methylphenidate (RITALIN) 5 MG tablet Take 1 tablet (5 mg total) by mouth daily. 30 tablet 0  . valACYclovir (VALTREX) 500 MG tablet Take 1 tablet by mouth as needed.     . zolpidem (AMBIEN) 10 MG tablet Take 10 mg by mouth at bedtime as needed.      No current facility-administered medications for this visit.    Review of Systems Review of Systems  Constitutional: Negative.   Respiratory: Negative.   Cardiovascular: Negative.     Blood pressure 102/58, pulse 77, resp. rate 13, height 5\' 4"  (1.626 m), weight 119 lb (53.978 kg), last menstrual period 11/11/2014.  Physical Exam Physical Exam  Constitutional: She is oriented to person, place, and time. She appears well-developed and well-nourished.  Eyes: Conjunctivae are normal. No scleral icterus.  Neck: Neck supple.  Cardiovascular: Normal rate, regular rhythm and normal heart sounds.   Pulmonary/Chest: Effort normal and breath sounds normal. Right breast exhibits no inverted nipple, no mass, no nipple discharge, no skin change and no tenderness. Left breast exhibits mass. Left breast exhibits no inverted nipple, no nipple discharge, no skin change and no tenderness.    Lymphadenopathy:    She has no cervical adenopathy.    She has no axillary adenopathy.  Neurological: She is alert and oriented to person, place, and time.  Skin: Skin is warm and dry.    Data Reviewed Screening mammograms dated 10/28/2014 reported dense breast and a possible nodule in the medial inferior aspect of the left breast for which additional views were recommended. BI-RADS-0.  Focal spot compression views and ultrasound were completed 11/09/2014.  Masses in the retroareolar and lower inner quadrant were suggested on focal spot compression views. Ultrasound examination in the lower inner quadrant showed a 0.6 x 1.0 x 1.4 cm oval, circumscribed, hypoechoic mass. Multiple cysts were identified throughout the breast especially in the 6:00 position with a maximum diameter 1 cm and in the 3:00 position with maximum diameters up to 1.2 cm.  Six-month follow-up of the cystic lesions and biopsy of the solid lesions were recommended. BI-RADS-4.  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      Plan    Pros and cons of ultrasound-guided biopsy were reviewed. The patient was amenable to proceed.  10 mL of 0.5% Xylocaine with 0.25% Marcaine with 1-200,000 of epinephrine was utilized well tolerated. ChloraPrep was applied to the skin.  Under ultrasound guidance the left breast mass at the 8:00 position, 4 cm from nipple measuring 1.0 x 1.4 x 1.5 cm was identified. The 14-gauge Finesse biopsy device was utilized and multiple samples were obtained from multiple locations. Postbiopsy clip was placed.  Due to the patient's reported allergy response to Steri-Strips the skin defect was closed with a single 5-0 Prolene simple suture followed by Telfa and Tegaderm dressing.  Written instructions for biopsy care were provided. The procedure was well tolerated.    The patient will be contacted when pathology is available.  PCP:  Mattie Marlin 11/11/2014, 8:37 PM

## 2014-11-12 ENCOUNTER — Telehealth: Payer: Self-pay | Admitting: *Deleted

## 2014-11-12 NOTE — Telephone Encounter (Signed)
Phone call from Dr Saralyn Pilar recent left breast biopsy, fibroadenoma with fibrocystic changes.

## 2014-11-12 NOTE — Telephone Encounter (Signed)
Notified patient as instructed, patient pleased. Discussed follow-up appointments, patient agrees, placed in recalls.   

## 2014-11-18 ENCOUNTER — Ambulatory Visit (INDEPENDENT_AMBULATORY_CARE_PROVIDER_SITE_OTHER): Payer: BLUE CROSS/BLUE SHIELD | Admitting: *Deleted

## 2014-11-18 DIAGNOSIS — N63 Unspecified lump in breast: Secondary | ICD-10-CM

## 2014-11-18 DIAGNOSIS — N6320 Unspecified lump in the left breast, unspecified quadrant: Secondary | ICD-10-CM

## 2014-11-18 NOTE — Progress Notes (Signed)
Patient came in today for a wound check/left breast biopsy.  The biopsy site is clean, with no signs of infection noted, minimal bruising. One suture removed. Follow up as scheduled.

## 2014-11-18 NOTE — Patient Instructions (Signed)
The patient is aware to call back for any questions or concerns. Continue self breast exams. Call office for any new breast issues or concerns. 

## 2014-12-07 ENCOUNTER — Encounter: Payer: Self-pay | Admitting: Internal Medicine

## 2014-12-22 ENCOUNTER — Other Ambulatory Visit: Payer: Self-pay | Admitting: Internal Medicine

## 2014-12-22 DIAGNOSIS — F988 Other specified behavioral and emotional disorders with onset usually occurring in childhood and adolescence: Secondary | ICD-10-CM

## 2014-12-22 MED ORDER — METHYLPHENIDATE HCL 5 MG PO TABS: 5.0000 mg | ORAL_TABLET | Freq: Every day | ORAL | Status: DC

## 2014-12-22 NOTE — Telephone Encounter (Signed)
Ok to refill,  printed rx  

## 2014-12-22 NOTE — Telephone Encounter (Signed)
Patient requested refill on ritalin last refill was 04/01/14 last OV was 2/16 please advise ok to fill?

## 2014-12-23 NOTE — Telephone Encounter (Signed)
Patient notified and script placed at front desk. 

## 2015-02-22 ENCOUNTER — Telehealth: Payer: Self-pay | Admitting: *Deleted

## 2015-02-22 DIAGNOSIS — F988 Other specified behavioral and emotional disorders with onset usually occurring in childhood and adolescence: Secondary | ICD-10-CM

## 2015-02-22 MED ORDER — METHYLPHENIDATE HCL 5 MG PO TABS: 5.0000 mg | ORAL_TABLET | Freq: Every day | ORAL | Status: DC

## 2015-02-22 NOTE — Telephone Encounter (Signed)
Left detailed message that Rx will be ready tomorrow and requesting to schedule appoint to be seen early August.

## 2015-02-22 NOTE — Telephone Encounter (Signed)
Pt called requesting Ritalin refill.  Last refill 4.26.16.  Lat OV 2.8.16.  Please advise refill

## 2015-02-22 NOTE — Telephone Encounter (Signed)
Ok to refill,  printed rx  Needs appt in early august

## 2015-04-05 ENCOUNTER — Ambulatory Visit: Payer: BLUE CROSS/BLUE SHIELD | Admitting: Internal Medicine

## 2015-04-19 ENCOUNTER — Ambulatory Visit (INDEPENDENT_AMBULATORY_CARE_PROVIDER_SITE_OTHER): Payer: BLUE CROSS/BLUE SHIELD | Admitting: Sports Medicine

## 2015-04-19 ENCOUNTER — Encounter: Payer: Self-pay | Admitting: Internal Medicine

## 2015-04-19 ENCOUNTER — Ambulatory Visit
Admission: RE | Admit: 2015-04-19 | Discharge: 2015-04-19 | Disposition: A | Discharge status: Home / Self Care | Payer: BLUE CROSS/BLUE SHIELD | Source: Ambulatory Visit | Attending: Sports Medicine | Admitting: Sports Medicine

## 2015-04-19 ENCOUNTER — Encounter: Payer: Self-pay | Admitting: Sports Medicine

## 2015-04-19 ENCOUNTER — Ambulatory Visit (INDEPENDENT_AMBULATORY_CARE_PROVIDER_SITE_OTHER): Payer: BLUE CROSS/BLUE SHIELD | Admitting: Internal Medicine

## 2015-04-19 VITALS — BP 115/76 | HR 77 | Ht 65.0 in | Wt 118.0 lb

## 2015-04-19 VITALS — BP 104/60 | HR 86 | Temp 98.4°F | Resp 14 | Ht 64.0 in | Wt 120.5 lb

## 2015-04-19 DIAGNOSIS — F4321 Adjustment disorder with depressed mood: Secondary | ICD-10-CM | POA: Diagnosis not present

## 2015-04-19 DIAGNOSIS — F900 Attention-deficit hyperactivity disorder, predominantly inattentive type: Secondary | ICD-10-CM | POA: Diagnosis not present

## 2015-04-19 DIAGNOSIS — M25551 Pain in right hip: Secondary | ICD-10-CM

## 2015-04-19 DIAGNOSIS — F909 Attention-deficit hyperactivity disorder, unspecified type: Secondary | ICD-10-CM

## 2015-04-19 DIAGNOSIS — F988 Other specified behavioral and emotional disorders with onset usually occurring in childhood and adolescence: Secondary | ICD-10-CM

## 2015-04-19 MED ORDER — METHYLPHENIDATE HCL 5 MG PO TABS: 5.0000 mg | ORAL_TABLET | Freq: Two times a day (BID) | ORAL | Status: DC

## 2015-04-19 NOTE — Patient Instructions (Signed)
We will try increasing the ritalin to twice daily starting with an extra half tablet after lunch ,  And increase to 5 mg for the afternoon dose if needed  Will try Vyvanse of no improvement in your  symptoms    Here are the names of several well respected female therapists in the area that can help you through this difficult time:  Lennon Alstrom    [phone] [place]   [phone] [place] [phone]  Achilles Dunk  [phone]  Altha Harm

## 2015-04-19 NOTE — Progress Notes (Signed)
Subjective:  Patient ID: Kristi Spence, female    DOB: November 11, 1970  Age: 44 y.o. MRN: [id number]  CC: The primary encounter diagnosis was ADD (attention deficit disorder). Diagnoses of Grief reaction and ADD (attention deficit hyperactivity disorder, inattentive type) were also pertinent to this visit.  HPI Kristi Spence presents for medication management of ADD diagnosed at age 23, managed with low dose ritalin   She is requesting a change in medication after meeting with her son's psychiatrist. Reviewed her previous medications:.  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 at the time .  Doesn't take ritalin daily,  only when  at home trying to multitask.  She has not tried taking second dose in afternoon.  Requesting referral to psychotherapy for counseling. Husband is an active alcoholic and she has a major life decision to make.  She has undergone left breast biopsy for benign mass March 2016..  Right hip pain evaluated by Sports Medicine DO today at [facility]  .  PT and possible orthotics for running shoes planned    Outpatient Prescriptions Prior to Visit  Medication Sig Dispense Refill  . etonogestrel (IMPLANON) 68 MG IMPL implant Inject 1 each into the skin once.    . valACYclovir (VALTREX) 500 MG tablet Take 1 tablet by mouth as needed.     . methylphenidate (RITALIN) 5 MG tablet Take 1 tablet (5 mg total) by mouth daily. 30 tablet 0  . triamcinolone cream (KENALOG) 0.1 %     . zolpidem (AMBIEN) 10 MG tablet Take 10 mg by mouth at bedtime as needed.     No facility-administered medications prior to visit.    Review of Systems;  Patient denies headache, fevers, malaise, unintentional weight loss, skin rash, eye pain, sinus congestion and sinus pain, sore throat, dysphagia,  hemoptysis , cough, dyspnea, wheezing, chest pain,  palpitations, orthopnea, edema, abdominal pain, nausea, melena, diarrhea, constipation, flank pain, dysuria, hematuria, urinary  Frequency, nocturia, numbness, tingling, seizures,  Focal weakness, Loss of consciousness,  Tremor, insomnia, depression, anxiety, and suicidal ideation.      Objective:  BP 104/60 mmHg  Pulse 86  Temp(Src) 98.4 F (36.9 C) (Oral)  Resp 14  Ht 5\' 4"  (1.626 m)  Wt 120 lb 8 oz (54.658 kg)  BMI 20.67 kg/m2  SpO2 98%  LMP 02/17/2015  BP Readings from Last 3 Encounters:  04/19/15 104/60  04/19/15 115/76  11/11/14 102/58    Wt Readings from Last 3 Encounters:  04/19/15 120 lb 8 oz (54.658 kg)  04/19/15 118 lb (53.524 kg)  11/11/14 119 lb (53.978 kg)    General appearance: alert, cooperative and appears stated age Ears: normal TM's and external ear canals both ears Throat: lips, mucosa, and tongue normal; teeth and gums normal Neck: no adenopathy, no carotid bruit, supple, symmetrical, trachea midline and thyroid not enlarged, symmetric, no tenderness/mass/nodules Back: symmetric, no curvature. ROM normal. No CVA tenderness. Lungs: clear to auscultation bilaterally Heart: regular rate and rhythm, S1, S2 normal, no murmur, click, rub or gallop Abdomen: soft, non-tender; bowel sounds normal; no masses,  no organomegaly Pulses: 2+ and symmetric Skin: Skin color, texture, turgor normal. No rashes or lesions Lymph nodes: Cervical, supraclavicular, and axillary nodes normal.  No results found for: HGBA1C  Lab Results  Component Value Date   CREATININE 0.8 04/01/2014   CREATININE  0.8 07/28/2011    Lab Results  Component Value Date   WBC 5.3 04/01/2014   HGB 14.4 04/01/2014   HCT 43.6 04/01/2014   PLT 213.0 04/01/2014   GLUCOSE 82 04/01/2014   CHOL 180 07/28/2011   TRIG 40.0 07/28/2011   HDL 68.60 07/28/2011   LDLCALC 103* 07/28/2011   ALT 26 04/01/2014   AST 29 04/01/2014   NA 136 04/01/2014   K 4.6 04/01/2014   CL 102 04/01/2014    CREATININE 0.8 04/01/2014   BUN 14 04/01/2014   CO2 24 04/01/2014   TSH 0.43 04/01/2014    No results found.  Assessment & Plan:   Problem List Items Addressed This Visit      Unprioritized   ADD (attention deficit hyperactivity disorder, inattentive type)    Advise to try adding ritalin in teh early afternoon,  Followed by a trial of vyvanse, as an alternative to ritalin bid if not tolerated.       Grief reaction    Secondary to mother's untimely death.  She continues to grieve, had to clean out her mother's home last weekend which was difficult,  Has received formal grief counselling .  Now considering leaving her husband due to his alcoholism.  Pateint was given the names of several counsellors in the area.        Other Visit Diagnoses    ADD (attention deficit disorder)    -  Primary    Relevant Medications    methylphenidate (RITALIN) 5 MG tablet      A total of 25 minutes of face to face time was spent with patient more than half of which was spent in counselling about the above mentioned conditions  and coordination of care    I have changed [name]'s methylphenidate. I am also having her maintain her zolpidem, valACYclovir, etonogestrel, and triamcinolone cream.  Meds ordered this encounter  Medications  . methylphenidate (RITALIN) 5 MG tablet    Sig: Take 1 tablet (5 mg total) by mouth 2 (two) times daily with breakfast and lunch.    Dispense:  60 tablet    Refill:  0    Medications Discontinued During This Encounter  Medication Reason  . methylphenidate (RITALIN) 5 MG tablet Reorder    Follow-up: No Follow-up on file.   Crecencio Mc, MD

## 2015-04-19 NOTE — Progress Notes (Addendum)
Patient ID: Kristi Spence, female   DOB: 11-05-1970, 44 y.o.   MRN: [id number]  CC: R Hip pain   HPI:  She states she has had right hip pain and back tightness for >6 mos.  Started gradually but became severe quite quickly (over 3-4 days) Does not remember doing any specific activities that may have caused it at the start Notes that the tightness is in the right mid back, radiating around the side and ending in a burning hip pain at the iliac crest Constant, but waxing and waning Sitting and ab exercises make it quite severe, however walking and running or stairclimbing also exacerbate it Spinning does not make it as bad.  Has tried stretching which does not help.  Sees a chiropractor and masseuse but their efforts have been futile. Had an old rx for mobic and took a 4 day course that helped some but it flared again after exercise.  Also tried taking a 2 week hiatus from exercise that helped a little but not completely. No red flag symptoms in relation to back- numbness/tingling, shooting pain down leg, weakness   BP 115/76 mmHg  Pulse 77  Ht 5\' 5"  (1.651 m)  Wt 118 lb (53.524 kg)  BMI 19.64 kg/m2 Physical Exam  Constitutional: She is oriented to person, place, and time and well-developed, well-nourished, and in no distress. No distress.  HENT:  Head: Normocephalic and atraumatic.  Eyes: EOM are normal. Right eye exhibits no discharge. Left eye exhibits no discharge. No scleral icterus.  Neck: Normal range of motion.  Pulmonary/Chest: Effort normal.  Musculoskeletal: Normal range of motion. She exhibits no edema or tenderness.  Back exam:  Normal alignment on standing. Normal range of motion with flexion and extension, bilateral rotation. Pain with rotation to right on facet loading. No pain to left. No tenderness to  Palpation on paraspinals, SI joint, gluteus maximus.  SLR, FABER, FADIR without pain and with adequate mobility.   Hip exam:  Normal alignment on  standing. Tenderness to palpation at iliac crest. No pain over trochanteric bursae or IT band or radiating to groin.  Hip flexion and extension, abduction, adduction with 5/5 strength, pain in right hip with resisted flexion of left hip.  Knee and ankle with 5/5 strength globally.  Log roll without pain and with adequate mobility.  Pain with passive adduction or right hip. No pain with passive abduction.   Gait normal.  Sensation in b/l LE normal.    Neurological: She is alert and oriented to person, place, and time. No cranial nerve deficit. Gait normal.  Skin: Skin is warm and dry. No rash noted. She is not diaphoretic. No erythema. No pallor.  Psychiatric: Affect normal.    Assessment:  44 year old woman with no significant PMH here with c/o R hip and back pain x 6 months. Due to location of pain (right lumbar spine radiating to iliac crest) and pain with ab exercises, walking, and sitting, suspect inflammation or quadratus lumborum, external oblique, iliopsoas, or a combination of these. Less likely would be a bony lesion, but due to duration would like to rule that out today too.   PLAN:  AP XR of pelvis Will recommend physical therapy at [facility] near her home Will have her follow up in 6 weeks and bring her running shoes with her inserts for gait eval. May consider orthotics at that time.  Recommend that she continue activity but at a modified level until she sees that physical therapist  and gets their recommendations.   Waynard Reeds, MD This pt seen and evaluated with Dr. Micheline Chapman.  Patient seen and evaluated with the above resident. I agree with her plan of care. We will start physical therapy and the patient will follow-up with me in 6 weeks. I've asked her to bring her running shoes in to that visit for a running analysis. We will contact her with the results of her x-ray once available.  Addendum: X-rays are normal.

## 2015-04-19 NOTE — Progress Notes (Signed)
Pre-visit discussion using our clinic review tool. No additional management support is needed unless otherwise documented below in the visit note.  

## 2015-04-20 NOTE — Assessment & Plan Note (Signed)
Secondary to mother's untimely death.  She continues to grieve, had to clean out her mother's home last weekend which was difficult,  Has received formal grief counselling

## 2015-04-20 NOTE — Assessment & Plan Note (Addendum)
Advise to try adding ritalin in teh early afternoon,  Followed by a trial of vyvanse, as an alternative to ritalin bid if not tolerated.

## 2015-05-04 ENCOUNTER — Telehealth: Payer: Self-pay | Admitting: *Deleted

## 2015-05-04 DIAGNOSIS — F988 Other specified behavioral and emotional disorders with onset usually occurring in childhood and adolescence: Secondary | ICD-10-CM

## 2015-05-04 MED ORDER — METHYLPHENIDATE HCL 5 MG PO TABS: 5.0000 mg | ORAL_TABLET | Freq: Two times a day (BID) | ORAL | Status: DC

## 2015-05-04 NOTE — Telephone Encounter (Signed)
Pt called states she accidentally threw away her Ritalin Rx written on 8.22.16.  Verified that last refill date was 6.30.16.  Please advise refill

## 2015-05-04 NOTE — Telephone Encounter (Signed)
Left message on VM the Rx will be ready for pick up this afternoon.

## 2015-05-04 NOTE — Telephone Encounter (Signed)
Ok to refill,  printed rx  

## 2015-05-18 ENCOUNTER — Other Ambulatory Visit: Payer: BLUE CROSS/BLUE SHIELD

## 2015-05-18 ENCOUNTER — Ambulatory Visit (INDEPENDENT_AMBULATORY_CARE_PROVIDER_SITE_OTHER): Payer: BLUE CROSS/BLUE SHIELD | Admitting: General Surgery

## 2015-05-18 ENCOUNTER — Encounter: Payer: Self-pay | Admitting: General Surgery

## 2015-05-18 VITALS — BP 100/60 | HR 68 | Resp 12 | Ht 64.75 in | Wt 121.0 lb

## 2015-05-18 DIAGNOSIS — N6320 Unspecified lump in the left breast, unspecified quadrant: Secondary | ICD-10-CM

## 2015-05-18 DIAGNOSIS — D242 Benign neoplasm of left breast: Secondary | ICD-10-CM | POA: Diagnosis not present

## 2015-05-18 DIAGNOSIS — N63 Unspecified lump in breast: Secondary | ICD-10-CM | POA: Diagnosis not present

## 2015-05-18 DIAGNOSIS — F4321 Adjustment disorder with depressed mood: Secondary | ICD-10-CM | POA: Diagnosis not present

## 2015-05-18 NOTE — Patient Instructions (Addendum)
The patient is aware to call back for any questions or concerns. Continue self breast exams. Call office for any new breast issues or concerns. 

## 2015-05-18 NOTE — Progress Notes (Signed)
Patient ID: Kristi Spence, female   DOB: 05-19-1971, 44 y.o.   MRN: [id number]  Chief Complaint  Patient presents with  . Breast Problem    HPI Kristi Spence is a 44 y.o. female.  who presents for her follow up breast biopsy and breast evaluation. The most recent mammogram was done on 11-09-14.  Patient does perform regular self breast checks and gets regular mammograms done. She states her breast are still tender and worse when she is "supposed to " have her menstrual period. No new breast lumps noted on her own self exam..    HPI  Past Medical History  Diagnosis Date  . ADD (attention deficit disorder)   . Raynaud's disease 2013    Past Surgical History  Procedure Laterality Date  . Cesarean section  02/2003, 11/2005  . Breast biopsy Left 11-11-14    FIBROADENOMA    Family History  Problem Relation Age of Onset  . Heart disease Father   . Hypertension Father   . Cancer Maternal Grandfather     leukemia  . Stroke Paternal Grandmother   . Stroke Paternal Grandfather   . Cancer Mother     squamous cell    Social History Social History  Substance Use Topics  . Smoking status: Never Smoker   . Smokeless tobacco: Never Used  . Alcohol Use: 3.0 oz/week    5 Standard drinks or equivalent per week    Allergies  Allergen Reactions  . Penicillins Hives  . Sulfa Antibiotics Hives  . Other Rash    Steri strips    Current Outpatient Prescriptions  Medication Sig Dispense Refill  . etonogestrel (IMPLANON) 68 MG IMPL implant Inject 1 each into the skin once.    . methylphenidate (RITALIN) 5 MG tablet Take 1 tablet (5 mg total) by mouth 2 (two) times daily with breakfast and lunch. 60 tablet 0  . triamcinolone cream (KENALOG) 0.1 %     . valACYclovir (VALTREX) 500 MG tablet Take 1 tablet by mouth as needed.     . zolpidem (AMBIEN) 10 MG tablet Take 10 mg by mouth at bedtime as needed.     No current facility-administered medications for this visit.     Review of Systems Review of Systems  Constitutional: Negative.   Respiratory: Negative.   Cardiovascular: Negative.   Neurological: Positive for headaches.    Blood pressure 100/60, pulse 68, resp. rate 12, height 5' 4.75" (1.645 m), weight 121 lb (54.885 kg), last menstrual period 02/17/2015.  Physical Exam Physical Exam  Constitutional: She is oriented to person, place, and time. She appears well-developed and well-nourished.  HENT:  Mouth/Throat: Oropharynx is clear and moist.  Eyes: Conjunctivae are normal. No scleral icterus.  Neck: Neck supple.  Cardiovascular: Normal rate, regular rhythm and normal heart sounds.   Pulmonary/Chest: Effort normal and breath sounds normal. Right breast exhibits no inverted nipple, no mass, no nipple discharge, no skin change and no tenderness. Left breast exhibits no inverted nipple, no mass, no nipple discharge, no skin change and no tenderness.    New, focal thickening of the upper outer quadrant left breast.  Lymphadenopathy:    She has no cervical adenopathy.    She has no axillary adenopathy.  Neurological: She is alert and oriented to person, place, and time.  Skin: Skin is warm and dry.  Psychiatric: Her behavior is normal.    Data Reviewed Ultrasound examination of the left breast was undertaken to confirm stability of the previously identified  fibroadenoma in the lower inner quadrant. In the 8:00 position, 4 cm from the nipple a well-defined hypoechoic mass abutting the underlying pectoralis fascia measuring 0.6 x 0.9 x 1.2 cm is identified. This is smoothly marginated, shows posterior acoustic enhancement the biopsy clip is evident. This lesion is a fibroadenoma. In the 1:00 position, 6 cm from the nipple in the area of newly appreciated thickening an anechoic smoothly marginated nodule with posterior acoustic enhancement measuring 0.5 x 0.52 x 0.55 cm is identified. There is some question of thickening of the wall and this may  represent a complex cyst. The patient was amenable to aspiration. This was completed using 1 mL of 1% plain Xylocaine. The area completely resolved with return of a small amount of clear fluid which was discarded.  At the 3:00 position, 3 cm from the nipple a simple cyst as identified as evident in past exams measuring 0.3 x 0.39 x 0.43 cm. At the 6:00 position a small simple cyst is identified 3 cm from nipple measuring less than 4 mm in maximal diameter. This is smoothly marginated and shows good posterior acoustic enhancement. BI-RADS-2.  Assessment    Stable fibroadenoma the left breast.  Multiple left breast cyst, asymptomatic.    Plan    We'll arrange for follow-up examination in 6 months.     Patient will be asked to return to the office in 6 months with a bilateral screening mammogram.  PCP:  Mattie Marlin 05/19/2015, 5:44 AM

## 2015-05-19 DIAGNOSIS — D242 Benign neoplasm of left breast: Secondary | ICD-10-CM | POA: Insufficient documentation

## 2015-05-31 ENCOUNTER — Ambulatory Visit
Admission: RE | Admit: 2015-05-31 | Discharge: 2015-05-31 | Disposition: A | Discharge status: Home / Self Care | Payer: BLUE CROSS/BLUE SHIELD | Source: Ambulatory Visit | Attending: Sports Medicine | Admitting: Sports Medicine

## 2015-05-31 ENCOUNTER — Encounter: Payer: Self-pay | Admitting: Sports Medicine

## 2015-05-31 ENCOUNTER — Ambulatory Visit (INDEPENDENT_AMBULATORY_CARE_PROVIDER_SITE_OTHER): Payer: BLUE CROSS/BLUE SHIELD | Admitting: Sports Medicine

## 2015-05-31 VITALS — BP 110/69 | Ht 65.0 in | Wt 118.0 lb

## 2015-05-31 DIAGNOSIS — M25551 Pain in right hip: Secondary | ICD-10-CM | POA: Diagnosis not present

## 2015-05-31 NOTE — Progress Notes (Addendum)
   Subjective:    Patient ID: Kristi Spence, female    DOB: 06/24/1971, 44 y.o.   MRN: [id number]  HPI44 y/o female with no significant PMHx who presents with right anterior hip pain, ongoing for she says about 1 year.  She does not recall an inciting event.  She describes it as a burning pain that extends from right anterior iliac crest, radiating to anterior thigh and sometimes to knee.  Does not extend below the knee.  She was evaluated previously and it was thought to be possible inflammation of QL vs iliopsoas.  She has taken anti-inflammatories without relief.  She has gone to PT, which she states makes it worse. It is worse with sitting for >15 minutes and standing up after sitting for a long time.  It is better with hot yoga.  She enjoys spinning, but her pain has been exacerbated with spin class and she has no longer been attending it.  She reports intermittent pain, unchanged from last visit except that the intensity of pain has increased.  She denies numbness, tingling, bowel or bladder incontinence.      Review of Systems Gen: denies fevers, chills MSK: +burning anterior iliac crest pain, denies joint pain or swelling Neuro: denies numbness, tingling, bowel or bladder incontinence    Objective:   Physical Exam Gen: NAD, A&Ox3 MSK: Spine with full ROM, nontender to palpation, +paraspinal hypertonicity +tightness with rotation and side bending right, no pain with flexion or extension of spine.    Hip: full ROM in all planes, neg Thomas test, no pain over greater trochanter, no ITB tenderness, FABER neg, FADDIR neg but does have some anterior iliac crest pain with manuever, neg ober Osteopathic: +SI compression test on left, posterior left innominate  Neuro: Neg SLR, 5/5 muscle strength with hip flexion, knee extension and flexion, dorsiflexion and plantarflexion of ankle. DTR's 2/4 patellar and achilles bilaterally           Assessment & Plan:  1) Right anterior hip pain-  worsened since last visit.  Anticipate that is is likely secondary to lumbar radiculopathy in L2 or L3 regions.  Will order x-rays and an MRI to check for herniated disc.  Will send to PT again for different exercises, focusing on strengthening lumbar region.  Offered gabapentin at night for pain relief, but pt would like to just do PT and MRI for now.    Addendum: X-rays shows some straightening of the normal lumbar lordosis. Otherwise unremarkable.

## 2015-06-02 ENCOUNTER — Inpatient Hospital Stay: Admission: RE | Admit: 2015-06-02 | Payer: BLUE CROSS/BLUE SHIELD | Source: Ambulatory Visit

## 2015-06-15 ENCOUNTER — Inpatient Hospital Stay: Admission: RE | Admit: 2015-06-15 | Payer: BLUE CROSS/BLUE SHIELD | Source: Ambulatory Visit

## 2015-06-23 ENCOUNTER — Telehealth: Payer: Self-pay | Admitting: Internal Medicine

## 2015-06-23 ENCOUNTER — Other Ambulatory Visit: Payer: Self-pay | Admitting: Internal Medicine

## 2015-06-23 DIAGNOSIS — M542 Cervicalgia: Secondary | ICD-10-CM | POA: Insufficient documentation

## 2015-06-23 MED ORDER — PREDNISONE 10 MG PO TABS: ORAL_TABLET | ORAL | Status: DC

## 2015-06-23 MED ORDER — CYCLOBENZAPRINE HCL 10 MG PO TABS: 10.0000 mg | ORAL_TABLET | Freq: Three times a day (TID) | ORAL | Status: DC | PRN

## 2015-06-23 NOTE — Telephone Encounter (Signed)
Spoke with patient, scheduled for appointment on Friday.  Per Dr. Derrel Nip, sent in prescriptions for Prednisone and flexeril to [facility].

## 2015-06-23 NOTE — Telephone Encounter (Signed)
Patient left voice mail stating she may have a pinched nerve in her neck the pain runs through her shoulder down arm into left side. This has been going on for 7 days she has been in the bed for 2 days.

## 2015-06-25 ENCOUNTER — Other Ambulatory Visit: Payer: Self-pay | Admitting: Internal Medicine

## 2015-06-25 ENCOUNTER — Ambulatory Visit (INDEPENDENT_AMBULATORY_CARE_PROVIDER_SITE_OTHER): Payer: BLUE CROSS/BLUE SHIELD | Admitting: Internal Medicine

## 2015-06-25 ENCOUNTER — Encounter: Payer: Self-pay | Admitting: Internal Medicine

## 2015-06-25 VITALS — BP 104/66 | HR 85 | Temp 98.8°F | Ht 65.0 in | Wt 121.2 lb

## 2015-06-25 DIAGNOSIS — Z23 Encounter for immunization: Secondary | ICD-10-CM

## 2015-06-25 DIAGNOSIS — L309 Dermatitis, unspecified: Secondary | ICD-10-CM

## 2015-06-25 DIAGNOSIS — M5010 Cervical disc disorder with radiculopathy, unspecified cervical region: Secondary | ICD-10-CM | POA: Diagnosis not present

## 2015-06-25 DIAGNOSIS — M4722 Other spondylosis with radiculopathy, cervical region: Secondary | ICD-10-CM | POA: Diagnosis not present

## 2015-06-25 MED ORDER — ZOLPIDEM TARTRATE 10 MG PO TABS: 10.0000 mg | ORAL_TABLET | Freq: Every evening | ORAL | Status: DC | PRN

## 2015-06-25 NOTE — Telephone Encounter (Signed)
Ok to refill, refill sent  

## 2015-06-25 NOTE — Patient Instructions (Addendum)
Start the prednisone  Taper in the morning   Wear the cervical collar when you can,  including sleeping  continue ice,  Alternate with heat 15 minutes per  Treatment.Madaline Brilliant to use your TENS unit as wel   We will call [facility] about the  cream  For your rash

## 2015-06-25 NOTE — Progress Notes (Signed)
Pre visit review using our clinic review tool, if applicable. No additional management support is needed unless otherwise documented below in the visit note. 

## 2015-06-25 NOTE — Telephone Encounter (Signed)
Please advise? See note from Pharmacy.

## 2015-06-25 NOTE — Progress Notes (Signed)
Subjective:  Patient ID: Kristi Spence, female    DOB: 1971/06/23  Age: 44 y.o. MRN: [id number]  CC: The primary encounter diagnosis was Encounter for immunization. Diagnoses of Cervical spondylosis with radiculopathy, Cervical disc disorder with radiculopathy of cervical region, and Dermatitis were also pertinent to this visit.  HPI Kristi Spence presents for left sided shoulder, neck and scapular pain.  The pain started after partipicating in a hot Yoga class in which she held a position for an extender period of time.  She felt the muscles surrounding her shoulder blade start to spasm.  She has also had left sided  neck pain for the past week, and the pain radiates to the shoulder and forearm,  But have improved since Monday with rest and use of Aleve.      Outpatient Prescriptions Prior to Visit  Medication Sig Dispense Refill  . cyclobenzaprine (FLEXERIL) 10 MG tablet Take 1 tablet (10 mg total) by mouth 3 (three) times daily as needed for muscle spasms. 30 tablet 0  . etonogestrel (IMPLANON) 68 MG IMPL implant Inject 1 each into the skin once.    . etonogestrel (NEXPLANON) 68 MG IMPL implant Inject into the skin.    . methylphenidate (RITALIN) 5 MG tablet Take 1 tablet (5 mg total) by mouth 2 (two) times daily with breakfast and lunch. 60 tablet 0  . methylphenidate (RITALIN) 5 MG tablet Take 5 mg by mouth.    . predniSONE (DELTASONE) 10 MG tablet 6 tablets on Day 1 , then reduce by 1 tablet daily until gone 21 tablet 0  . valACYclovir (VALTREX) 500 MG tablet Take 1 tablet by mouth as needed.     . zolpidem (AMBIEN) 10 MG tablet Take 10 mg by mouth.    . triamcinolone cream (KENALOG) 0.1 %     . zolpidem (AMBIEN) 10 MG tablet Take 10 mg by mouth at bedtime as needed.     No facility-administered medications prior to visit.    Review of Systems;  Patient denies headache, fevers, malaise, unintentional weight loss, skin rash, eye pain, sinus congestion and sinus  pain, sore throat, dysphagia,  hemoptysis , cough, dyspnea, wheezing, chest pain, palpitations, orthopnea, edema, abdominal pain, nausea, melena, diarrhea, constipation, flank pain, dysuria, hematuria, urinary  Frequency, nocturia, numbness, tingling, seizures,  Focal weakness, Loss of consciousness,  Tremor, insomnia, depression, anxiety, and suicidal ideation.      Objective:  BP 104/66 mmHg  Pulse 85  Temp(Src) 98.8 F (37.1 C) (Oral)  Ht 5\' 5"  (1.651 m)  Wt 121 lb 3.2 oz (54.976 kg)  BMI 20.17 kg/m2  SpO2 99%  LMP 05/30/2015 (Exact Date)  BP Readings from Last 3 Encounters:  06/25/15 104/66  05/31/15 110/69  05/18/15 100/60    Wt Readings from Last 3 Encounters:  06/25/15 121 lb 3.2 oz (54.976 kg)  05/31/15 118 lb (53.524 kg)  05/18/15 121 lb (54.885 kg)    General appearance: alert, cooperative and appears stated age Ears: normal TM's and external ear canals both ears Throat: lips, mucosa, and tongue normal; teeth and gums normal Neck: no adenopathy, no carotid bruit, supple, symmetrical, trachea midline and thyroid not enlarged, symmetric.  Some tenderness to posterior neck muscles ,  Trapezius muscles.  Muscle spasm noted along scapular superior border.  Back: symmetric, no curvature. ROM normal. No CVA tenderness. Lungs: clear to auscultation bilaterally Heart: regular rate and rhythm, S1, S2 normal, no murmur, click, rub or gallop Abdomen: soft, non-tender; bowel sounds normal;  no masses,  no organomegaly Pulses: 2+ and symmetric Skin: Skin color, texture, turgor normal. No rashes or lesions Lymph nodes: Cervical, supraclavicular, and axillary nodes normal.  No results found for: HGBA1C  Lab Results  Component Value Date   CREATININE 0.8 04/01/2014   CREATININE 0.8 07/28/2011    Lab Results  Component Value Date   WBC 5.3 04/01/2014   HGB 14.4 04/01/2014   HCT 43.6 04/01/2014   PLT 213.0 04/01/2014   GLUCOSE 82 04/01/2014   CHOL 180 07/28/2011   TRIG  40.0 07/28/2011   HDL 68.60 07/28/2011   LDLCALC 103* 07/28/2011   ALT 26 04/01/2014   AST 29 04/01/2014   NA 136 04/01/2014   K 4.6 04/01/2014   CL 102 04/01/2014   CREATININE 0.8 04/01/2014   BUN 14 04/01/2014   CO2 24 04/01/2014   TSH 0.43 04/01/2014    Dg Lumbar Spine 2-3 Views  05/31/2015  CLINICAL DATA:  RIGHT hip pain after sitting for a period of time or exercising EXAM: LUMBAR SPINE - 2-3 VIEW COMPARISON:  None; correlation MRI lumbar spine 07/02/2007 FINDINGS: Five non-rib-bearing lumbar vertebra. Osseous mineralization grossly normal for technique. Vertebral body and disc space heights maintained. Mild straightening of lumbar lordosis. No acute fracture, subluxation or bone destruction. No spondylolysis. IMPRESSION: No acute osseous abnormalities. Electronically Signed   By: Lavonia Dana M.D.   On: 05/31/2015 11:56    Assessment & Plan:   Problem List Items Addressed This Visit    Cervical disc disorder with radiculopathy of cervical region    Prednisone taper, ice and heat.        Dermatitis    She is requesting a refill of the steroid cream, which has been done.        Other Visit Diagnoses    Encounter for immunization    -  Primary    Cervical spondylosis with radiculopathy        Relevant Orders    DME Other see comment       I have changed [name]'s zolpidem. I am also having her maintain her valACYclovir, etonogestrel, methylphenidate, etonogestrel, methylphenidate, zolpidem, cyclobenzaprine, and predniSONE.  Meds ordered this encounter  Medications  . zolpidem (AMBIEN) 10 MG tablet    Sig: Take 1 tablet (10 mg total) by mouth at bedtime as needed.    Dispense:  30 tablet    Refill:  0   A total of 25 minutes of face to face time was spent with patient more than half of which was spent in counselling about the above mentioned conditions  and coordination of care   Medications Discontinued During This Encounter  Medication Reason  . zolpidem  (AMBIEN) 10 MG tablet Reorder    Follow-up: No Follow-up on file.   Crecencio Mc, MD

## 2015-06-27 DIAGNOSIS — M5010 Cervical disc disorder with radiculopathy, unspecified cervical region: Secondary | ICD-10-CM | POA: Insufficient documentation

## 2015-06-27 DIAGNOSIS — L309 Dermatitis, unspecified: Secondary | ICD-10-CM | POA: Insufficient documentation

## 2015-06-27 NOTE — Assessment & Plan Note (Signed)
She is requesting a refill of the steroid cream, which has been done.

## 2015-06-27 NOTE — Assessment & Plan Note (Signed)
Prednisone taper, ice and heat.

## 2015-08-03 ENCOUNTER — Other Ambulatory Visit: Payer: Self-pay | Admitting: Obstetrics and Gynecology

## 2015-08-03 DIAGNOSIS — N6320 Unspecified lump in the left breast, unspecified quadrant: Secondary | ICD-10-CM

## 2015-08-05 ENCOUNTER — Other Ambulatory Visit: Payer: Self-pay | Admitting: Internal Medicine

## 2015-08-05 DIAGNOSIS — F988 Other specified behavioral and emotional disorders with onset usually occurring in childhood and adolescence: Secondary | ICD-10-CM

## 2015-08-05 NOTE — Telephone Encounter (Signed)
Patient called wanting a refill. please advise?

## 2015-08-06 ENCOUNTER — Telehealth: Payer: Self-pay | Admitting: Internal Medicine

## 2015-08-06 MED ORDER — METHYLPHENIDATE HCL 5 MG PO TABS: 5.0000 mg | ORAL_TABLET | Freq: Two times a day (BID) | ORAL | Status: DC

## 2015-08-06 NOTE — Telephone Encounter (Signed)
90 day supply authorized.

## 2015-09-03 ENCOUNTER — Other Ambulatory Visit: Payer: Self-pay

## 2015-09-03 DIAGNOSIS — N6320 Unspecified lump in the left breast, unspecified quadrant: Secondary | ICD-10-CM

## 2015-10-29 ENCOUNTER — Ambulatory Visit
Admission: RE | Admit: 2015-10-29 | Discharge: 2015-10-29 | Disposition: A | Discharge status: Home / Self Care | Payer: BLUE CROSS/BLUE SHIELD | Source: Ambulatory Visit | Attending: Obstetrics and Gynecology | Admitting: Obstetrics and Gynecology

## 2015-10-29 ENCOUNTER — Other Ambulatory Visit: Payer: Self-pay | Admitting: Obstetrics and Gynecology

## 2015-10-29 DIAGNOSIS — N6320 Unspecified lump in the left breast, unspecified quadrant: Secondary | ICD-10-CM

## 2015-10-29 DIAGNOSIS — N6002 Solitary cyst of left breast: Secondary | ICD-10-CM | POA: Diagnosis not present

## 2015-10-29 DIAGNOSIS — N63 Unspecified lump in breast: Secondary | ICD-10-CM | POA: Diagnosis present

## 2015-11-09 ENCOUNTER — Ambulatory Visit: Payer: BLUE CROSS/BLUE SHIELD | Admitting: General Surgery

## 2015-11-11 ENCOUNTER — Encounter: Payer: Self-pay | Admitting: General Surgery

## 2015-11-11 ENCOUNTER — Ambulatory Visit (INDEPENDENT_AMBULATORY_CARE_PROVIDER_SITE_OTHER): Payer: BLUE CROSS/BLUE SHIELD | Admitting: General Surgery

## 2015-11-11 VITALS — BP 98/58 | HR 78 | Resp 12 | Ht 65.0 in | Wt 120.0 lb

## 2015-11-11 DIAGNOSIS — D242 Benign neoplasm of left breast: Secondary | ICD-10-CM | POA: Diagnosis not present

## 2015-11-11 NOTE — Progress Notes (Signed)
Patient ID: Kristi Spence, female   DOB: December 16, 1970, 45 y.o.   MRN: [id number]  Chief Complaint  Patient presents with  . Follow-up    mammogram    HPI Kristi Spence is a 45 y.o. female who presents for a breast evaluation. The most recent mammogram and left breast ultrasound was done on 11/03/15.  Patient does perform regular self breast checks and gets regular mammograms done.  I personally reviewed the patient's clinical history.   HPI  Past Medical History  Diagnosis Date  . ADD (attention deficit disorder)   . Raynaud's disease 2013    Past Surgical History  Procedure Laterality Date  . Cesarean section  02/2003, 11/2005  . Breast biopsy Left 11-11-14    FIBROADENOMA    Family History  Problem Relation Age of Onset  . Heart disease Father   . Hypertension Father   . Cancer Maternal Grandfather     leukemia  . Stroke Paternal Grandmother   . Stroke Paternal Grandfather   . Cancer Mother     squamous cell  . Breast cancer Neg Hx     Social History Social History  Substance Use Topics  . Smoking status: Never Smoker   . Smokeless tobacco: Never Used  . Alcohol Use: 3.0 oz/week    5 Standard drinks or equivalent per week    Allergies  Allergen Reactions  . Penicillins Hives  . Sulfa Antibiotics Hives  . Other Rash    Steri strips    Current Outpatient Prescriptions  Medication Sig Dispense Refill  . etonogestrel (NEXPLANON) 68 MG IMPL implant Inject into the skin.    . methylphenidate (RITALIN) 5 MG tablet Take 1 tablet (5 mg total) by mouth 2 (two) times daily with breakfast and lunch. 60 tablet 0  . valACYclovir (VALTREX) 500 MG tablet Take 1 tablet by mouth as needed.     . zolpidem (AMBIEN) 10 MG tablet Take 10 mg by mouth.     No current facility-administered medications for this visit.    Review of Systems Review of Systems  Constitutional: Negative.   Respiratory: Negative.   Cardiovascular: Negative.     Blood pressure  98/58, pulse 78, resp. rate 12, height 5\' 5"  (1.651 m), weight 120 lb (54.432 kg), last menstrual period 11/01/2015.  Physical Exam Physical Exam  Constitutional: She is oriented to person, place, and time. She appears well-developed and well-nourished.  Eyes: Conjunctivae are normal. No scleral icterus.  Neck: Neck supple.  Cardiovascular: Normal rate, regular rhythm and normal heart sounds.   Pulmonary/Chest: Effort normal and breath sounds normal. Right breast exhibits no inverted nipple, no mass, no nipple discharge, no skin change and no tenderness. Left breast exhibits no inverted nipple, no mass, no nipple discharge, no skin change and no tenderness.  Lymphadenopathy:    She has no cervical adenopathy.    She has no axillary adenopathy.  Neurological: She is alert and oriented to person, place, and time.  Skin: Skin is warm and dry.    Data Reviewed Bilateral mammogram dated 10/29/2015 showed no new areas of concern on imaging. Left retroareolar cyst. BI-RADS-2.  Assessment    Unremarkable breast exam, asymptomatic fibroadenoma previously confirmed on biopsy to be benign.    Plan    The patient was encouraged to complete monthly self examinations due to her petite size. We reviewed her mammograms together. She continued to have annual screening mammograms under the care of her primary care physician.     Patient  to return as needed. PCP:  Deborra Medina  This information has been scribed by Gaspar Cola CMA.  Robert Bellow 11/12/2015, 8:57 AM

## 2015-11-11 NOTE — Patient Instructions (Addendum)
Continue self breast exams. Call office for any new breast issues or concerns. Patient to return as needed.   

## 2015-11-25 ENCOUNTER — Other Ambulatory Visit: Payer: Self-pay | Admitting: Internal Medicine

## 2015-11-25 NOTE — Telephone Encounter (Signed)
Last OV 10/16/ok to fill Ambien?

## 2015-11-26 NOTE — Telephone Encounter (Signed)
?  OK TO REFILL °

## 2015-11-30 ENCOUNTER — Telehealth: Payer: Self-pay

## 2015-11-30 NOTE — Telephone Encounter (Signed)
PA started for Ambien on cover my meds.

## 2015-12-02 ENCOUNTER — Telehealth: Payer: Self-pay | Admitting: Internal Medicine

## 2015-12-02 MED ORDER — DOXEPIN HCL 150 MG PO CAPS: 150.0000 mg | ORAL_CAPSULE | Freq: Every day | ORAL | Status: DC

## 2015-12-02 NOTE — Telephone Encounter (Signed)
We will have to try an alternative for Ambien for daily use per her insurance unless she has a diagnosis of chronic insomnia from a neurologist or psychiatrist .  Trial of doxepin recommended and sent to pharmacy

## 2015-12-02 NOTE — Telephone Encounter (Signed)
Spoke with the patient, she will try the new medication and let us know if it works or not. Thanks

## 2015-12-10 ENCOUNTER — Other Ambulatory Visit: Payer: Self-pay | Admitting: Internal Medicine

## 2015-12-13 NOTE — Telephone Encounter (Signed)
This is a historical medication. Please advise?

## 2015-12-14 NOTE — Telephone Encounter (Signed)
refilled 

## 2015-12-27 ENCOUNTER — Other Ambulatory Visit: Payer: Self-pay | Admitting: Internal Medicine

## 2015-12-27 DIAGNOSIS — F988 Other specified behavioral and emotional disorders with onset usually occurring in childhood and adolescence: Secondary | ICD-10-CM

## 2015-12-27 MED ORDER — METHYLPHENIDATE HCL 5 MG PO TABS: 5.0000 mg | ORAL_TABLET | Freq: Two times a day (BID) | ORAL | Status: DC

## 2015-12-27 NOTE — Telephone Encounter (Signed)
Pt called needing a Rx refill for methylphenidate (RITALIN) 5 MG . Call pt when it's ready @ [phone]. Thank you!

## 2015-12-27 NOTE — Telephone Encounter (Signed)
Last refill was 10/07/15.  Thanks

## 2015-12-27 NOTE — Telephone Encounter (Signed)
Last OV was October .Please provide that info for controlled substances.   Refill for 30 days only.  OFFICE VISIT NEEDED prior to any more refills

## 2015-12-28 NOTE — Telephone Encounter (Signed)
Placed script at front for pick up and notified  Patient ready for pick up, patient is aware of appointment needed for refill.

## 2016-02-14 ENCOUNTER — Ambulatory Visit (INDEPENDENT_AMBULATORY_CARE_PROVIDER_SITE_OTHER): Payer: BLUE CROSS/BLUE SHIELD | Admitting: Internal Medicine

## 2016-02-14 ENCOUNTER — Encounter: Payer: Self-pay | Admitting: Internal Medicine

## 2016-02-14 VITALS — BP 108/76 | HR 94 | Temp 98.5°F | Resp 12 | Ht 65.0 in | Wt 122.5 lb

## 2016-02-14 DIAGNOSIS — Z630 Problems in relationship with spouse or partner: Secondary | ICD-10-CM

## 2016-02-14 DIAGNOSIS — N926 Irregular menstruation, unspecified: Secondary | ICD-10-CM

## 2016-02-14 DIAGNOSIS — F909 Attention-deficit hyperactivity disorder, unspecified type: Secondary | ICD-10-CM

## 2016-02-14 DIAGNOSIS — F988 Other specified behavioral and emotional disorders with onset usually occurring in childhood and adolescence: Secondary | ICD-10-CM

## 2016-02-14 DIAGNOSIS — E785 Hyperlipidemia, unspecified: Secondary | ICD-10-CM | POA: Diagnosis not present

## 2016-02-14 DIAGNOSIS — R5383 Other fatigue: Secondary | ICD-10-CM | POA: Diagnosis not present

## 2016-02-14 DIAGNOSIS — F900 Attention-deficit hyperactivity disorder, predominantly inattentive type: Secondary | ICD-10-CM

## 2016-02-14 DIAGNOSIS — Z7189 Other specified counseling: Secondary | ICD-10-CM

## 2016-02-14 LAB — COMPREHENSIVE METABOLIC PANEL WITH GFR
ALT: 24 U/L (ref 0–35)
AST: 26 U/L (ref 0–37)
Albumin: 4.4 g/dL (ref 3.5–5.2)
Alkaline Phosphatase: 45 U/L (ref 39–117)
BUN: 11 mg/dL (ref 6–23)
CO2: 27 meq/L (ref 19–32)
Calcium: 9.3 mg/dL (ref 8.4–10.5)
Chloride: 102 meq/L (ref 96–112)
Creatinine, Ser: 0.67 mg/dL (ref 0.40–1.20)
GFR: 101 mL/min
Glucose, Bld: 80 mg/dL (ref 70–99)
Potassium: 4.1 meq/L (ref 3.5–5.1)
Sodium: 138 meq/L (ref 135–145)
Total Bilirubin: 0.5 mg/dL (ref 0.2–1.2)
Total Protein: 7.2 g/dL (ref 6.0–8.3)

## 2016-02-14 LAB — LIPID PANEL
Cholesterol: 189 mg/dL (ref 0–200)
HDL: 69.7 mg/dL
LDL Cholesterol: 106 mg/dL — ABNORMAL HIGH (ref 0–99)
NonHDL: 118.97
Total CHOL/HDL Ratio: 3
Triglycerides: 67 mg/dL (ref 0.0–149.0)
VLDL: 13.4 mg/dL (ref 0.0–40.0)

## 2016-02-14 LAB — CBC WITH DIFFERENTIAL/PLATELET
BASOS PCT: 0.6 % (ref 0.0–3.0)
Basophils Absolute: 0 10*3/uL (ref 0.0–0.1)
EOS ABS: 0.2 10*3/uL (ref 0.0–0.7)
Eosinophils Relative: 2.7 % (ref 0.0–5.0)
HCT: 42.8 % (ref 36.0–46.0)
Hemoglobin: 14.4 g/dL (ref 12.0–15.0)
LYMPHS ABS: 2 10*3/uL (ref 0.7–4.0)
Lymphocytes Relative: 30.7 % (ref 12.0–46.0)
MCHC: 33.6 g/dL (ref 30.0–36.0)
MCV: 102.6 fl — ABNORMAL HIGH (ref 78.0–100.0)
MONOS PCT: 8.2 % (ref 3.0–12.0)
Monocytes Absolute: 0.5 10*3/uL (ref 0.1–1.0)
NEUTROS ABS: 3.8 10*3/uL (ref 1.4–7.7)
Neutrophils Relative %: 57.8 % (ref 43.0–77.0)
PLATELETS: 216 10*3/uL (ref 150.0–400.0)
RBC: 4.17 Mil/uL (ref 3.87–5.11)
RDW: 13.7 % (ref 11.5–15.5)
WBC: 6.6 10*3/uL (ref 4.0–10.5)

## 2016-02-14 LAB — IRON AND TIBC
%SAT: 58 % — ABNORMAL HIGH (ref 11–50)
Iron: 145 ug/dL (ref 40–190)
TIBC: 252 ug/dL (ref 250–450)
UIBC: 107 ug/dL — ABNORMAL LOW (ref 125–400)

## 2016-02-14 LAB — VITAMIN B12: Vitamin B-12: 401 pg/mL (ref 211–911)

## 2016-02-14 LAB — FERRITIN: Ferritin: 74.1 ng/mL (ref 10.0–291.0)

## 2016-02-14 LAB — TSH: TSH: 0.61 u[IU]/mL (ref 0.35–4.50)

## 2016-02-14 LAB — POCT URINE PREGNANCY: Preg Test, Ur: NEGATIVE

## 2016-02-14 MED ORDER — METHYLPHENIDATE HCL 5 MG PO TABS: 5.0000 mg | ORAL_TABLET | Freq: Two times a day (BID) | ORAL | Status: DC

## 2016-02-14 MED ORDER — ZOLPIDEM TARTRATE 10 MG PO TABS: 10.0000 mg | ORAL_TABLET | Freq: Every evening | ORAL | Status: DC | PRN

## 2016-02-14 NOTE — Progress Notes (Signed)
Pre-visit discussion using our clinic review tool. No additional management support is needed unless otherwise documented below in the visit note.  

## 2016-02-14 NOTE — Patient Instructions (Signed)
Here are the names of several well respected female therapists   Lennon Alstrom    [phone] [place]   [phone] [place] [phone]  Achilles Dunk  [phone]  [facility]erica [employer]uster meetings in [place] and [place] printed out for you:

## 2016-02-15 ENCOUNTER — Encounter: Payer: Self-pay | Admitting: Internal Medicine

## 2016-02-15 DIAGNOSIS — Z0000 Encounter for general adult medical examination without abnormal findings: Secondary | ICD-10-CM | POA: Insufficient documentation

## 2016-02-15 DIAGNOSIS — Z630 Problems in relationship with spouse or partner: Secondary | ICD-10-CM | POA: Insufficient documentation

## 2016-02-15 DIAGNOSIS — N926 Irregular menstruation, unspecified: Secondary | ICD-10-CM | POA: Insufficient documentation

## 2016-02-15 LAB — FOLATE RBC: RBC Folate: 582 ng/mL (ref >280)

## 2016-02-15 NOTE — Assessment & Plan Note (Signed)
Not severe enough to warrant workup other than ruling out pregnancy, and thyroid issues , which have been done today  Likely due to Nexplanon.   Lab Results  Component Value Date   TSH 0.61 02/14/2016

## 2016-02-15 NOTE — Assessment & Plan Note (Signed)
A total of30 minutes was spent with patient more than half of which was spent in counseling patient on the above mentioned issues and coordination of care.  Recommended counselling for her and participation in local [employer]. Contact information for both has been given to patient  today

## 2016-02-15 NOTE — Assessment & Plan Note (Signed)
Refills on ritalin given,

## 2016-02-15 NOTE — Progress Notes (Signed)
Subjective:  Patient ID: Kristi Spence, female    DOB: 26-Oct-1970  Age: 45 y.o. MRN: [id number]  CC: The primary encounter diagnosis was ADD (attention deficit disorder). Diagnoses of Menstrual irregularity, Other fatigue, Hyperlipidemia, ADD (attention deficit hyperactivity disorder, inattentive type), and Counseling for marital and partner problems were also pertinent to this visit.  HPI Kristi Spence presents for follow up on ADD, other chronic issues  1) recent prolonged menses, on year 3 of IUD.  Typically her menses are light and > 3 days duration.  Recently had one of 6 days duration, heavy, with cramping.  Has not done a home pregnancy test as directed by her gynecologist's triage RN.   2) ADD: tolerating medications.  Using ambien to manage chronic insomnia,  Exercising regularly.  3) Marital discord:  Very frustrated with husband's continued alcohol abuse. Drinks both at home and when out to dinner with family, does not relinquish the car keys and she feels he has prioritized his need for alcohol above the safety of his family.  Patient has not taken children or self to [place].    Outpatient Prescriptions Prior to Visit  Medication Sig Dispense Refill  . etonogestrel (NEXPLANON) 68 MG IMPL implant Inject into the skin.    . valACYclovir (VALTREX) 500 MG tablet TAKE ONE TABLET BY MOUTH DAILY 30 tablet 5  . methylphenidate (RITALIN) 5 MG tablet Take 1 tablet (5 mg total) by mouth 2 (two) times daily with breakfast and lunch. 60 tablet 0  . zolpidem (AMBIEN) 10 MG tablet TAKE ONE TABLET AT BEDTIME AS NEEDED 30 tablet 2  . doxepin (SINEQUAN) 150 MG capsule Take 1 capsule (150 mg total) by mouth at bedtime. (Patient not taking: Reported on 02/14/2016) 30 capsule 0   No facility-administered medications prior to visit.    Review of Systems;  Patient denies headache, fevers, malaise, unintentional weight loss, skin rash, eye pain, sinus congestion and sinus pain,  sore throat, dysphagia,  hemoptysis , cough, dyspnea, wheezing, chest pain, palpitations, orthopnea, edema, abdominal pain, nausea, melena, diarrhea, constipation, flank pain, dysuria, hematuria, urinary  Frequency, nocturia, numbness, tingling, seizures,  Focal weakness, Loss of consciousness,  Tremor, insomnia, depression, anxiety, and suicidal ideation.      Objective:  BP 108/76 mmHg  Pulse 94  Temp(Src) 98.5 F (36.9 C) (Oral)  Resp 12  Ht 5\' 5"  (1.651 m)  Wt 122 lb 8 oz (55.566 kg)  BMI 20.39 kg/m2  SpO2 99%  LMP 01/17/2016 (Approximate)  BP Readings from Last 3 Encounters:  02/14/16 108/76  11/11/15 98/58  06/25/15 104/66    Wt Readings from Last 3 Encounters:  02/14/16 122 lb 8 oz (55.566 kg)  11/11/15 120 lb (54.432 kg)  06/25/15 121 lb 3.2 oz (54.976 kg)    General appearance: alert, cooperative and appears stated age Ears: normal TM's and external ear canals both ears Throat: lips, mucosa, and tongue normal; teeth and gums normal Neck: no adenopathy, no carotid bruit, supple, symmetrical, trachea midline and thyroid not enlarged, symmetric, no tenderness/mass/nodules Back: symmetric, no curvature. ROM normal. No CVA tenderness. Lungs: clear to auscultation bilaterally Heart: regular rate and rhythm, S1, S2 normal, no murmur, click, rub or gallop Abdomen: soft, non-tender; bowel sounds normal; no masses,  no organomegaly Pulses: 2+ and symmetric Skin: Skin color, texture, turgor normal. No rashes or lesions Lymph nodes: Cervical, supraclavicular, and axillary nodes normal. Psych: affect normal, makes good eye contact. No fidgeting,  Smiles easily.  Denies suicidal thoughts  No results found for: HGBA1C  Lab Results  Component Value Date   CREATININE 0.67 02/14/2016   CREATININE 0.8 04/01/2014   CREATININE 0.8 07/28/2011    Lab Results  Component Value Date   WBC 6.6 02/14/2016   HGB 14.4 02/14/2016   HCT 42.8 02/14/2016   PLT 216.0 02/14/2016    GLUCOSE 80 02/14/2016   CHOL 189 02/14/2016   TRIG 67.0 02/14/2016   HDL 69.70 02/14/2016   LDLCALC 106* 02/14/2016   ALT 24 02/14/2016   AST 26 02/14/2016   NA 138 02/14/2016   K 4.1 02/14/2016   CL 102 02/14/2016   CREATININE 0.67 02/14/2016   BUN 11 02/14/2016   CO2 27 02/14/2016   TSH 0.61 02/14/2016    US Breast Ltd Uni Left Inc Axilla  10/29/2015  CLINICAL DATA:  45 year old female for annual bilateral mammograms and followup left breast cystic lesion. Biopsy-proven fibroadenoma within the lower inner left breast. EXAM: DIGITAL DIAGNOSTIC BILATERAL MAMMOGRAM WITH 3D TOMOSYNTHESIS WITH CAD ULTRASOUND LEFT BREAST COMPARISON:  Previous exam(s). ACR Breast Density Category c: The breast tissue is heterogeneously dense, which may obscure small masses. FINDINGS: 2D and 3D full field views of both breast demonstrate a circumscribed oval mass within the lower inner left breast containing a biopsy clip, compatible with biopsy-proven fibroadenoma. A stable circumscribed oval mass within the retroareolar left breast again identified. No suspicious mass, non procedural distortion or worrisome calcifications identified. Mammographic images were processed with CAD. Targeted ultrasound is performed, showing a 0.8 x 0.3 x 0.5 cm simple cyst in the retroareolar left breast corresponding to the mammographic finding. IMPRESSION: No mammographic evidence of breast malignancy. Benign simple cyst in the retroareolar left breast. RECOMMENDATION: Bilateral screening mammograms in 1 year. I have discussed the findings and recommendations with the patient. Results were also provided in writing at the conclusion of the visit. If applicable, a reminder letter will be sent to the patient regarding the next appointment. BI-RADS CATEGORY  2: Benign. Electronically Signed   By: Margarette Canada M.D.   On: 10/29/2015 10:51   Mm Diag Breast Tomo Bilateral  10/29/2015  CLINICAL DATA:  45 year old female for annual bilateral  mammograms and followup left breast cystic lesion. Biopsy-proven fibroadenoma within the lower inner left breast. EXAM: DIGITAL DIAGNOSTIC BILATERAL MAMMOGRAM WITH 3D TOMOSYNTHESIS WITH CAD ULTRASOUND LEFT BREAST COMPARISON:  Previous exam(s). ACR Breast Density Category c: The breast tissue is heterogeneously dense, which may obscure small masses. FINDINGS: 2D and 3D full field views of both breast demonstrate a circumscribed oval mass within the lower inner left breast containing a biopsy clip, compatible with biopsy-proven fibroadenoma. A stable circumscribed oval mass within the retroareolar left breast again identified. No suspicious mass, non procedural distortion or worrisome calcifications identified. Mammographic images were processed with CAD. Targeted ultrasound is performed, showing a 0.8 x 0.3 x 0.5 cm simple cyst in the retroareolar left breast corresponding to the mammographic finding. IMPRESSION: No mammographic evidence of breast malignancy. Benign simple cyst in the retroareolar left breast. RECOMMENDATION: Bilateral screening mammograms in 1 year. I have discussed the findings and recommendations with the patient. Results were also provided in writing at the conclusion of the visit. If applicable, a reminder letter will be sent to the patient regarding the next appointment. BI-RADS CATEGORY  2: Benign. Electronically Signed   By: Margarette Canada M.D.   On: 10/29/2015 10:51    Assessment & Plan:   Problem List Items Addressed This Visit    ADD (attention  deficit hyperactivity disorder, inattentive type)    Refills on ritalin given,        Counseling for marital and partner problems    A total of30 minutes was spent with patient more than half of which was spent in counseling patient on the above mentioned issues and coordination of care.  Recommended counselling for her and participation in local [employer]. Contact information for both has been given to patient  today       Menstrual irregularity    Not severe enough to warrant workup other than ruling out pregnancy, and thyroid issues , which have been done today  Likely due to Nexplanon.   Lab Results  Component Value Date   TSH 0.61 02/14/2016         Relevant Orders   TSH (Completed)   POCT urine pregnancy (Completed)    Other Visit Diagnoses    ADD (attention deficit disorder)    -  Primary    Relevant Medications    methylphenidate (RITALIN) 5 MG tablet    methylphenidate (RITALIN) 5 MG tablet    Other fatigue        Relevant Orders    Comprehensive metabolic panel (Completed)    CBC with Differential/Platelet (Completed)    Vitamin B12 (Completed)    Folate RBC    Ferritin (Completed)    Iron and TIBC    Hyperlipidemia        Relevant Orders    Lipid panel (Completed)      A total of 40 minutes was spent with patient more than half of which was spent in counseling patient on the above mentioned issues , reviewing and explaining recent labs and imaging studies done, and coordination of care.   I have discontinued Ms. Kroon's doxepin. I have also changed her zolpidem. Additionally, I am having her maintain her etonogestrel, valACYclovir, methylphenidate, and methylphenidate.  Meds ordered this encounter  Medications  . DISCONTD: methylphenidate (RITALIN) 5 MG tablet    Sig: Take 1 tablet (5 mg total) by mouth 2 (two) times daily with breakfast and lunch.    Dispense:  60 tablet    Refill:  0  . zolpidem (AMBIEN) 10 MG tablet    Sig: Take 1 tablet (10 mg total) by mouth at bedtime as needed.    Dispense:  30 tablet    Refill:  5  . methylphenidate (RITALIN) 5 MG tablet    Sig: Take 1 tablet (5 mg total) by mouth 2 (two) times daily with breakfast and lunch.    Dispense:  60 tablet    Refill:  0    May refill on or after April 15 2016  . methylphenidate (RITALIN) 5 MG tablet    Sig: Take 1 tablet (5 mg total) by mouth 2 (two) times daily with breakfast and lunch.    Dispense:   60 tablet    Refill:  0    May refill on or after March 15, 2016    Medications Discontinued During This Encounter  Medication Reason  . doxepin (SINEQUAN) 150 MG capsule Change in therapy  . methylphenidate (RITALIN) 5 MG tablet Reorder  . zolpidem (AMBIEN) 10 MG tablet Reorder  . methylphenidate (RITALIN) 5 MG tablet Reorder  . methylphenidate (RITALIN) 5 MG tablet Reorder    Follow-up: No Follow-up on file.   Crecencio Mc, MD

## 2016-02-16 ENCOUNTER — Encounter: Payer: Self-pay | Admitting: *Deleted

## 2016-03-20 ENCOUNTER — Ambulatory Visit (INDEPENDENT_AMBULATORY_CARE_PROVIDER_SITE_OTHER): Payer: BLUE CROSS/BLUE SHIELD | Admitting: Sports Medicine

## 2016-03-20 VITALS — BP 115/75 | HR 74

## 2016-03-20 DIAGNOSIS — M5441 Lumbago with sciatica, right side: Secondary | ICD-10-CM

## 2016-03-20 DIAGNOSIS — M25551 Pain in right hip: Secondary | ICD-10-CM | POA: Diagnosis not present

## 2016-03-20 NOTE — Progress Notes (Signed)
   Subjective:    Patient ID: Lauree Chandler, female    DOB: 19-Nov-1970, 45 y.o.   MRN: [id number]  HPI chief complaint: Low back and right hip pain  Patient comes in today with persistent and worsening right-sided low back pain and right hip pain. She was last seen in our office back in October 2016. X-rays of her lumbar spine and hip were unremarkable. We did discuss proceeding with an MRI scan at that visit but the patient wanted to exhaust physical therapy. She has had several months of physical therapy but despite that her pain worsens. She describes an aching discomfort along the right side of her lower back with radiating burning discomfort into the lateral hip and down the thigh. No pain past the knee. She is also getting some occasional weakness with the right leg. She denies any deep-seated groin pain. Her symptoms have been present now for almost 2 years. She has been unable to exercise due to worsening pain. She is very frustrated by her disability.  Interim medical history reviewed Medications reviewed Allergies reviewed    Review of Systems    as above Objective:   Physical Exam  Well-developed, fit appearing. No acute distress. Awake alert and oriented 3. Vital signs reviewed  Lumbar spine: Good lumbar range of motion. There is some spasm of the right paraspinal musculature. No tenderness to palpation over the lumbar midline.  Right hip: She is tender to palpation directly over the greater trochanteric bursa. She has 4/5 strength with resisted hip abduction but 5/5 strength with resisted hip flexion. Negative logroll. Negative FADIR.  Neurological exam: In addition to the above-noted hip abduction weakness on the right, patient also has an absent Achilles reflex on the right compared to a 2/4 in the left. Patellar reflexes are 1/4 and equal bilaterally. The remainder of her strength in both lower extremities is 5/5. Sensation is intact to light-touch grossly.        Assessment & Plan:   Chronic right hip and low back pain  Patient symptoms of been present now for almost 2 years in persist despite conservative treatment including anti-inflammatory medication and exhaustive physical therapy. I'm going to proceed with an MRI of both the lumbar spine and the right hip specifically to delineate between hip pathology such as a chronic muscle tear versus referred pain from her lumbar spine. Phone follow-up after I reviewed those studies at which time we'll delineate further treatment.

## 2016-03-24 ENCOUNTER — Other Ambulatory Visit: Payer: Self-pay | Admitting: Sports Medicine

## 2016-03-27 ENCOUNTER — Other Ambulatory Visit: Payer: BLUE CROSS/BLUE SHIELD

## 2016-03-27 ENCOUNTER — Inpatient Hospital Stay: Admission: RE | Admit: 2016-03-27 | Payer: BLUE CROSS/BLUE SHIELD | Source: Ambulatory Visit

## 2016-03-31 ENCOUNTER — Other Ambulatory Visit: Payer: BLUE CROSS/BLUE SHIELD

## 2016-04-04 ENCOUNTER — Ambulatory Visit
Admission: RE | Admit: 2016-04-04 | Discharge: 2016-04-04 | Disposition: A | Discharge status: Home / Self Care | Payer: BLUE CROSS/BLUE SHIELD | Source: Ambulatory Visit | Attending: Sports Medicine | Admitting: Sports Medicine

## 2016-04-04 DIAGNOSIS — M25551 Pain in right hip: Secondary | ICD-10-CM

## 2016-04-04 DIAGNOSIS — M5441 Lumbago with sciatica, right side: Secondary | ICD-10-CM

## 2016-04-06 ENCOUNTER — Telehealth: Payer: Self-pay | Admitting: Sports Medicine

## 2016-04-06 NOTE — Telephone Encounter (Signed)
I spoke with the patient on the phone today after reviewing MRI findings of both her right hip and her lumbar spine. Lumbar spine MRI is unremarkable. Right hip MRI shows findings consistent with moderate hip DJD and a probable superior labral tear of the right hip. She also has some peri-tendinosis but no bursitis. No muscle tearing. At this point in time, I recommended consultation with Dr. Ninfa Linden for his input. I've asked the patient to touch base with me once again after her consultation with Dr. Ninfa Linden. Patient is in agreement with this plan.

## 2016-04-06 NOTE — Telephone Encounter (Signed)
[facility] Tuesday 05/02/16 @ [place], [place], [place] [address] Phone: [phone]

## 2016-08-03 ENCOUNTER — Other Ambulatory Visit: Payer: Self-pay | Admitting: Obstetrics and Gynecology

## 2016-08-03 DIAGNOSIS — Z1231 Encounter for screening mammogram for malignant neoplasm of breast: Secondary | ICD-10-CM

## 2016-08-14 LAB — HM PAP SMEAR: HM PAP: NORMAL

## 2016-09-12 ENCOUNTER — Ambulatory Visit (INDEPENDENT_AMBULATORY_CARE_PROVIDER_SITE_OTHER): Payer: BLUE CROSS/BLUE SHIELD | Admitting: Internal Medicine

## 2016-09-12 ENCOUNTER — Encounter: Payer: Self-pay | Admitting: Internal Medicine

## 2016-09-12 DIAGNOSIS — I7300 Raynaud's syndrome without gangrene: Secondary | ICD-10-CM | POA: Diagnosis not present

## 2016-09-12 DIAGNOSIS — Z630 Problems in relationship with spouse or partner: Secondary | ICD-10-CM

## 2016-09-12 DIAGNOSIS — K581 Irritable bowel syndrome with constipation: Secondary | ICD-10-CM

## 2016-09-12 DIAGNOSIS — Z882 Allergy status to sulfonamides status: Secondary | ICD-10-CM

## 2016-09-12 DIAGNOSIS — F900 Attention-deficit hyperactivity disorder, predominantly inattentive type: Secondary | ICD-10-CM

## 2016-09-12 DIAGNOSIS — F901 Attention-deficit hyperactivity disorder, predominantly hyperactive type: Secondary | ICD-10-CM

## 2016-09-12 MED ORDER — PROMETHAZINE HCL 25 MG RE SUPP: 25.0000 mg | Freq: Four times a day (QID) | RECTAL | 0 refills | Status: DC | PRN

## 2016-09-12 MED ORDER — METHYLPHENIDATE HCL 5 MG PO TABS: 5.0000 mg | ORAL_TABLET | Freq: Every day | ORAL | 0 refills | Status: DC

## 2016-09-12 MED ORDER — MONTELUKAST SODIUM 10 MG PO TABS: 10.0000 mg | ORAL_TABLET | Freq: Every day | ORAL | 3 refills | Status: DC

## 2016-09-12 NOTE — Patient Instructions (Addendum)
Try taking allegra ,  famotidine 20 mg and  rx Singulair on the morning   Of any wine partaking .  This combination will block all histamine release and may prevent your headache   The nifedipine is a calcium channel blocker and is used to treat vasospasm of arteries,  But it can also lower your BP so try it BEFORE you get on the slopes

## 2016-09-12 NOTE — Progress Notes (Signed)
Subjective:  Patient ID: Kristi Spence, female    DOB: [birth year]  Age: 46 y.o. MRN: [id number]  CC: Diagnoses of Attention deficit hyperactivity disorder (ADHD), predominantly hyperactive type, Attention deficit hyperactivity disorder (ADHD), predominantly inattentive type, Counseling for marital and partner problems, Raynaud's phenomenon without gangrene, Irritable bowel syndrome with constipation, and Allergy to sulfa drugs were pertinent to this visit.  HPI Kristi Spence presents for follow up on ADD . Last seen in September.  At that time she was having increased marital discord due to her husband's ongoing alcohol abuse.  Since then he has agreed to couples counselling and she has noted an improved relationship .   Had norovirus over the holidays , both daughters were infected as well.  She lost 3-4 lbs during the illness but has regained the weight and has an excellent appetite. Marland Kitchen   She was referred to GI by her gynecologist for evaluation of post prandial upper abdominal pain in the setting of chronic constipation managed with prn bulk forming laxatives.  She was seen on Jan 11 by Jefm Bryant NP and  treated for gastritis with protonix,  tested for celiac disease, b12 deficiency and referred for screening colonoscopy given her FH of colon polyps.  Labs reviewed with patient today.  h pylori and celiac panels were negative. b12 was low at 294  (b12 was 401 and rbc folate 582  in June 2017)    IBS symptoms also prompted food allergy testing due to recurrent headaches. Food allergies to brewer's and baker's yeast.  Gets headache with red wine.  ADD: she has reduced her dose of ritalin to 2.5 mg ritalin twice daily as taking it only 5 days  Per week      Outpatient Medications Prior to Visit  Medication Sig Dispense Refill  . etonogestrel (NEXPLANON) 68 MG IMPL implant Inject into the skin.    . valACYclovir (VALTREX) 500 MG tablet TAKE ONE TABLET BY MOUTH DAILY (Patient  not taking: Reported on 09/12/2016) 30 tablet 5  . zolpidem (AMBIEN) 10 MG tablet Take 1 tablet (10 mg total) by mouth at bedtime as needed. (Patient not taking: Reported on 09/12/2016) 30 tablet 5  . methylphenidate (RITALIN) 5 MG tablet Take 1 tablet (5 mg total) by mouth 2 (two) times daily with breakfast and lunch. (Patient not taking: Reported on 09/12/2016) 60 tablet 0  . methylphenidate (RITALIN) 5 MG tablet Take 1 tablet (5 mg total) by mouth 2 (two) times daily with breakfast and lunch. (Patient not taking: Reported on 09/12/2016) 60 tablet 0   No facility-administered medications prior to visit.     Review of Systems;  Patient denies headache, fevers, malaise, unintentional weight loss, skin rash, eye pain, sinus congestion and sinus pain, sore throat, dysphagia,  hemoptysis , cough, dyspnea, wheezing, chest pain, palpitations, orthopnea, edema, abdominal pain, nausea, melena, diarrhea, constipation, flank pain, dysuria, hematuria, urinary  Frequency, nocturia, numbness, tingling, seizures,  Focal weakness, Loss of consciousness,  Tremor, insomnia, depression, anxiety, and suicidal ideation.      Objective:  BP 110/70   Pulse 89   Temp 98.7 F (37.1 C) (Oral)   Resp 16   Ht 5\' 5"  (1.651 m)   Wt 121 lb 6 oz (55.1 kg)   LMP 07/12/2016 (Within Months)   SpO2 97%   BMI 20.20 kg/m   BP Readings from Last 3 Encounters:  09/12/16 110/70  03/20/16 115/75  02/14/16 108/76    Wt Readings from Last 3 Encounters:  09/12/16 121 lb 6 oz (55.1 kg)  04/04/16 122 lb (55.3 kg)  04/04/16 122 lb (55.3 kg)    General appearance: alert, cooperative and appears stated age Ears: normal TM's and external ear canals both ears Throat: lips, mucosa, and tongue normal; teeth and gums normal Neck: no adenopathy, no carotid bruit, supple, symmetrical, trachea midline and thyroid not enlarged, symmetric, no tenderness/mass/nodules Back: symmetric, no curvature. ROM normal. No CVA tenderness. Lungs:  clear to auscultation bilaterally Heart: regular rate and rhythm, S1, S2 normal, no murmur, click, rub or gallop Abdomen: soft, non-tender; bowel sounds normal; no masses,  no organomegaly Pulses: 2+ and symmetric Skin: Skin color, texture, turgor normal. No rashes or lesions Lymph nodes: Cervical, supraclavicular, and axillary nodes normal.  No results found for: HGBA1C  Lab Results  Component Value Date   CREATININE 0.67 02/14/2016   CREATININE 0.8 04/01/2014   CREATININE 0.8 07/28/2011    Lab Results  Component Value Date   WBC 6.6 02/14/2016   HGB 14.4 02/14/2016   HCT 42.8 02/14/2016   PLT 216.0 02/14/2016   GLUCOSE 80 02/14/2016   CHOL 189 02/14/2016   TRIG 67.0 02/14/2016   HDL 69.70 02/14/2016   LDLCALC 106 (H) 02/14/2016   ALT 24 02/14/2016   AST 26 02/14/2016   NA 138 02/14/2016   K 4.1 02/14/2016   CL 102 02/14/2016   CREATININE 0.67 02/14/2016   BUN 11 02/14/2016   CO2 27 02/14/2016   TSH 0.61 02/14/2016    Mr Lumbar Spine Wo Contrast  Result Date: 04/04/2016 CLINICAL DATA:  Low back pain with right-sided sciatica EXAM: MRI LUMBAR SPINE WITHOUT CONTRAST TECHNIQUE: Multiplanar, multisequence MR imaging of the lumbar spine was performed. No intravenous contrast was administered. COMPARISON:  MRI 07/02/2007 FINDINGS: Segmentation:  Normal segmentation.  Lowest disc space L5-S1 Alignment:  Normal Vertebrae: Negative for fracture or mass. Normal bone marrow. Scattered small hemangiomata incidentally noted. Tarlov cyst in the sacral canal. Conus medullaris: Extends to the T12-L1 level and appears normal. Paraspinal and other soft tissues: Paraspinous muscles normal. Retroperitoneal structures normal. Disc levels: Disc spaces normal in height and hydration. No significant degenerative change. No disc protrusion or spinal stenosis. IMPRESSION: Negative lumbar MRI.  No change from 2008. Electronically Signed   By: Franchot Gallo M.D.   On: 04/04/2016 07:09   Mr Hip Right  Wo Contrast  Result Date: 04/04/2016 CLINICAL DATA:  Right hip and leg pain for 1-1/2 years. EXAM: MR OF THE RIGHT HIP WITHOUT CONTRAST TECHNIQUE: Multiplanar, multisequence MR imaging was performed. No intravenous contrast was administered. COMPARISON:  07/02/2007 FINDINGS: Bones: Patchy low T1 marrow signal in the lumbar spine, pelvis and hips is likely due to residual red marrow in this young female. Other possibilities would include anemia, smoking or obesity. Both hips are normally located.  No stress fracture or AVN Articular cartilage and labrum Articular cartilage: Moderate degenerative changes for age. Mild joint space narrowing, osteophytic spurring, degenerative chondrosis and areas of subchondral cystic change. Labrum: Findings are suspicious for a labral tear involving the right hip (series 8, image 12 and series 12, image 11) if clinically indicated a dedicated MR arthrogram of the right hip may be helpful for further evaluation. Joint or bursal effusion Joint effusion:  None. Bursae:  Bilateral peritendinosis without bursitis. Muscles and tendons Muscles and tendons: The surrounding hip and pelvic musculature demonstrate normal signal intensity. No muscle tear, myositis, muscle lesion or significant fatty atrophy. The hamstring tendons are intact. Mild tendinopathy in  the attachment region on the left. Other findings Miscellaneous: No significant intrapelvic abnormalities. There is a simple appearing cyst associated with the right ovary. No inguinal mass or hernia. IMPRESSION: 1. Patchy low T1 marrow signal appears stable since 2008 as discussed above. 2. Moderate hip joint degenerative changes for the patient's age. 3. Bilateral peritendinosis without findings for trochanteric bursitis. 4. An anterior superior labral tear of the right hip is suspected. 5. No significant intrapelvic abnormalities. Electronically Signed   By: Marijo Sanes M.D.   On: 04/04/2016 09:11    Assessment & Plan:    Problem List Items Addressed This Visit    Allergy to sulfa drugs    She also develops headaches after one glass of red wine. No symptoms concerning for anaphylaxis,but laments the fact that wine is off limits.  discussed preventive maneuver including trial of allegra, famotidine and singulair       Attention deficit hyperactivity disorder (ADHD), predominantly inattentive type    Refills on ritalin given,  She is using 2.5 mg twice daily /5 days per week.       Relevant Medications   methylphenidate (RITALIN) 5 MG tablet   methylphenidate (RITALIN) 5 MG tablet   Counseling for marital and partner problems    She reports improved marital relationship since husband has agreed to couples counselling.       IBS (irritable bowel syndrome)    Celiac panel was negative,  protonix was started,  And colonoscopy scheduled.       Relevant Medications   pantoprazole (PROTONIX) 40 MG tablet   Wheat Dextrin (BENEFIBER DRINK MIX PO)   Raynaud phenomenon    Aggravated by cold weather,  Has been given calcium channel blocker by family friend Dr Saralyn Pilar,  But has not tried it yet for ski trip.  Advised to try in advance as it may lower her blood pressure to orthostatic levels.         Other Visit Diagnoses    Attention deficit hyperactivity disorder (ADHD), predominantly hyperactive type          I am having Ms. Tierce start on promethazine and montelukast. I am also having her maintain her etonogestrel, valACYclovir, zolpidem, pantoprazole, Wheat Dextrin (BENEFIBER DRINK MIX PO), methylphenidate, methylphenidate, and methylphenidate.  Meds ordered this encounter  Medications  . pantoprazole (PROTONIX) 40 MG tablet    Sig: Take 1 tablet by mouth daily.  . Wheat Dextrin (BENEFIBER DRINK MIX PO)    Sig: Take by mouth.  . promethazine (PHENERGAN) 25 MG suppository    Sig: Place 1 suppository (25 mg total) rectally every 6 (six) hours as needed for nausea or vomiting.    Dispense:  12  each    Refill:  0  . montelukast (SINGULAIR) 10 MG tablet    Sig: Take 1 tablet (10 mg total) by mouth at bedtime.    Dispense:  30 tablet    Refill:  3  . methylphenidate (RITALIN) 5 MG tablet    Sig: Take 1 tablet (5 mg total) by mouth daily.    Dispense:  30 tablet    Refill:  0    May refill on or after November 09 2016  . methylphenidate (RITALIN) 5 MG tablet    Sig: Take 1 tablet (5 mg total) by mouth daily.    Dispense:  30 tablet    Refill:  0    May refill on or after October 12 2016  . methylphenidate (RITALIN) 5 MG tablet  Sig: Take 1 tablet (5 mg total) by mouth daily.    Dispense:  30 tablet    Refill:  0   A total of 40 minutes was spent with patient more than half of which was spent in counseling patient on the above mentioned issues , reviewing and explaining recent labs and imaging studies done, and coordination of care. Medications Discontinued During This Encounter  Medication Reason  . methylphenidate (RITALIN) 5 MG tablet   . methylphenidate (RITALIN) 5 MG tablet     Follow-up: Return in about 3 months (around 12/11/2016).   Crecencio Mc, MD

## 2016-09-14 DIAGNOSIS — Z882 Allergy status to sulfonamides status: Secondary | ICD-10-CM | POA: Insufficient documentation

## 2016-09-14 NOTE — Assessment & Plan Note (Signed)
Refills on ritalin given,  She is using 2.5 mg twice daily /5 days per week.  

## 2016-09-14 NOTE — Assessment & Plan Note (Signed)
Aggravated by cold weather,  Has been given calcium channel blocker by family friend Dr Saralyn Pilar,  But has not tried it yet for ski trip.  Advised to try in advance as it may lower her blood pressure to orthostatic levels.

## 2016-09-14 NOTE — Assessment & Plan Note (Signed)
She also develops headaches after one glass of red wine. No symptoms concerning for anaphylaxis,but laments the fact that wine is off limits.  discussed preventive maneuver including trial of allegra, famotidine and singulair

## 2016-09-14 NOTE — Assessment & Plan Note (Signed)
She reports improved marital relationship since husband has agreed to couples counselling.

## 2016-09-14 NOTE — Assessment & Plan Note (Signed)
Celiac panel was negative,  protonix was started,  And colonoscopy scheduled.

## 2016-10-11 ENCOUNTER — Telehealth: Payer: Self-pay | Admitting: Internal Medicine

## 2016-10-11 NOTE — Telephone Encounter (Signed)
Refilled on 02/14/2016. Last office visit was on 09/12/2016. Next office visit 12/15/2016.

## 2016-10-12 NOTE — Telephone Encounter (Signed)
Pt called returning your call. Thank you!  Call pt @ [phone]

## 2016-10-12 NOTE — Telephone Encounter (Signed)
Refill for 30 days  

## 2016-10-12 NOTE — Telephone Encounter (Signed)
LMTCB

## 2016-10-13 NOTE — Telephone Encounter (Signed)
Signed rx faxed to [facility]. Pt informed

## 2016-10-31 ENCOUNTER — Ambulatory Visit: Payer: BLUE CROSS/BLUE SHIELD | Attending: Obstetrics and Gynecology

## 2016-12-15 ENCOUNTER — Encounter: Payer: Self-pay | Admitting: Internal Medicine

## 2016-12-15 ENCOUNTER — Ambulatory Visit (INDEPENDENT_AMBULATORY_CARE_PROVIDER_SITE_OTHER): Payer: BLUE CROSS/BLUE SHIELD | Admitting: Internal Medicine

## 2016-12-15 DIAGNOSIS — G8929 Other chronic pain: Secondary | ICD-10-CM

## 2016-12-15 DIAGNOSIS — F900 Attention-deficit hyperactivity disorder, predominantly inattentive type: Secondary | ICD-10-CM

## 2016-12-15 DIAGNOSIS — M167 Other unilateral secondary osteoarthritis of hip: Secondary | ICD-10-CM

## 2016-12-15 DIAGNOSIS — M5441 Lumbago with sciatica, right side: Secondary | ICD-10-CM

## 2016-12-15 MED ORDER — METHYLPHENIDATE HCL 5 MG PO TABS: 5.0000 mg | ORAL_TABLET | Freq: Every day | ORAL | 0 refills | Status: DC

## 2016-12-15 MED ORDER — OMEPRAZOLE 20 MG PO CPDR: 20.0000 mg | DELAYED_RELEASE_CAPSULE | Freq: Every day | ORAL | 3 refills | Status: DC

## 2016-12-15 NOTE — Progress Notes (Signed)
Pre visit review using our clinic review tool, if applicable. No additional management support is needed unless otherwise documented below in the visit note. 

## 2016-12-15 NOTE — Progress Notes (Signed)
Subjective:  Patient ID: Kristi Spence, female    DOB: [birth year]  Age: 46 y.o. MRN: [id number]  CC: Diagnoses of Attention deficit hyperactivity disorder (ADHD), predominantly inattentive type, Chronic right-sided low back pain with right-sided sciatica, and Other secondary osteoarthritis of right hip were pertinent to this visit.  HPI Kristi Spence presents for medication refill and follow up on other issues.   PERSISTENT  RIGHT SIDED  HIP PAIN STARTS IN THORACIC SPINE and radiates down right leg.   FRUSTRATED at her inability to resolve her pain.  Feels better if she takes meloxicam 7.5 MG DAilLY  Has scoliosis.  HAS NOT HAD THORACIC SPINE MRI'D HAS PARASPINOUS  MUSCLE SPASM CONSTANTLY   COLONOSCOPY SCHEDULED FOR HISTORY OF POLYPS IN LATE April   ADD:  Patient has been taking the medication as prescribed and functioning well both at work and at home.  Denies side effects including tachycardia, nervousness and insomnia.  Outpatient Medications Prior to Visit  Medication Sig Dispense Refill  . etonogestrel (NEXPLANON) 68 MG IMPL implant Inject into the skin.    . montelukast (SINGULAIR) 10 MG tablet Take 1 tablet (10 mg total) by mouth at bedtime. 30 tablet 3  . pantoprazole (PROTONIX) 40 MG tablet Take 1 tablet by mouth daily.    . promethazine (PHENERGAN) 25 MG suppository Place 1 suppository (25 mg total) rectally every 6 (six) hours as needed for nausea or vomiting. 12 each 0  . valACYclovir (VALTREX) 500 MG tablet TAKE ONE TABLET BY MOUTH DAILY 30 tablet 5  . Wheat Dextrin (BENEFIBER DRINK MIX PO) Take by mouth.    . zolpidem (AMBIEN) 10 MG tablet TAKE ONE TABLET AT BEDTIME AS NEEDED 30 tablet 0  . methylphenidate (RITALIN) 5 MG tablet Take 1 tablet (5 mg total) by mouth daily. 30 tablet 0  . methylphenidate (RITALIN) 5 MG tablet Take 1 tablet (5 mg total) by mouth daily. 30 tablet 0  . methylphenidate (RITALIN) 5 MG tablet Take 1 tablet (5 mg total) by mouth  daily. 30 tablet 0   No facility-administered medications prior to visit.     Review of Systems;  Patient denies headache, fevers, malaise, unintentional weight loss, skin rash, eye pain, sinus congestion and sinus pain, sore throat, dysphagia,  hemoptysis , cough, dyspnea, wheezing, chest pain, palpitations, orthopnea, edema, abdominal pain, nausea, melena, diarrhea, constipation, flank pain, dysuria, hematuria, urinary  Frequency, nocturia, numbness, tingling, seizures,  Focal weakness, Loss of consciousness,  Tremor, insomnia, depression, anxiety, and suicidal ideation.      Objective:  BP 106/68   Pulse 77   Temp 98.3 F (36.8 C) (Oral)   Ht 5\' 5"  (1.651 m)   Wt 122 lb 6.4 oz (55.5 kg)   SpO2 98%   BMI 20.37 kg/m   BP Readings from Last 3 Encounters:  12/15/16 106/68  09/12/16 110/70  03/20/16 115/75    Wt Readings from Last 3 Encounters:  12/15/16 122 lb 6.4 oz (55.5 kg)  09/12/16 121 lb 6 oz (55.1 kg)  04/04/16 122 lb (55.3 kg)    General appearance: alert, cooperative and appears stated age Ears: normal TM's and external ear canals both ears Throat: lips, mucosa, and tongue normal; teeth and gums normal Neck: no adenopathy, no carotid bruit, supple, symmetrical, trachea midline and thyroid not enlarged, symmetric, no tenderness/mass/nodules Back: right sided paraspinous muscle hypertrophy,  No spinal tenderness,  Scoliosis noted , ROM normal. No CVA tenderness. Lungs: clear to auscultation bilaterally Heart: regular rate and  rhythm, S1, S2 normal, no murmur, click, rub or gallop Abdomen: soft, non-tender; bowel sounds normal; no masses,  no organomegaly Pulses: 2+ and symmetric Skin: Skin color, texture, turgor normal. No rashes or lesions Lymph nodes: Cervical, supraclavicular, and axillary nodes normal.  No results found for: HGBA1C  Lab Results  Component Value Date   CREATININE 0.67 02/14/2016   CREATININE 0.8 04/01/2014   CREATININE 0.8 07/28/2011     Lab Results  Component Value Date   WBC 6.6 02/14/2016   HGB 14.4 02/14/2016   HCT 42.8 02/14/2016   PLT 216.0 02/14/2016   GLUCOSE 80 02/14/2016   CHOL 189 02/14/2016   TRIG 67.0 02/14/2016   HDL 69.70 02/14/2016   LDLCALC 106 (H) 02/14/2016   ALT 24 02/14/2016   AST 26 02/14/2016   NA 138 02/14/2016   K 4.1 02/14/2016   CL 102 02/14/2016   CREATININE 0.67 02/14/2016   BUN 11 02/14/2016   CO2 27 02/14/2016   TSH 0.61 02/14/2016    Mr Lumbar Spine Wo Contrast  Result Date: 04/04/2016 CLINICAL DATA:  Low back pain with right-sided sciatica EXAM: MRI LUMBAR SPINE WITHOUT CONTRAST TECHNIQUE: Multiplanar, multisequence MR imaging of the lumbar spine was performed. No intravenous contrast was administered. COMPARISON:  MRI 07/02/2007 FINDINGS: Segmentation:  Normal segmentation.  Lowest disc space L5-S1 Alignment:  Normal Vertebrae: Negative for fracture or mass. Normal bone marrow. Scattered small hemangiomata incidentally noted. Tarlov cyst in the sacral canal. Conus medullaris: Extends to the T12-L1 level and appears normal. Paraspinal and other soft tissues: Paraspinous muscles normal. Retroperitoneal structures normal. Disc levels: Disc spaces normal in height and hydration. No significant degenerative change. No disc protrusion or spinal stenosis. IMPRESSION: Negative lumbar MRI.  No change from 2008. Electronically Signed   By: Franchot Gallo M.D.   On: 04/04/2016 07:09   Mr Hip Right Wo Contrast  Result Date: 04/04/2016 CLINICAL DATA:  Right hip and leg pain for 1-1/2 years. EXAM: MR OF THE RIGHT HIP WITHOUT CONTRAST TECHNIQUE: Multiplanar, multisequence MR imaging was performed. No intravenous contrast was administered. COMPARISON:  07/02/2007 FINDINGS: Bones: Patchy low T1 marrow signal in the lumbar spine, pelvis and hips is likely due to residual red marrow in this young female. Other possibilities would include anemia, smoking or obesity. Both hips are normally located.  No  stress fracture or AVN Articular cartilage and labrum Articular cartilage: Moderate degenerative changes for age. Mild joint space narrowing, osteophytic spurring, degenerative chondrosis and areas of subchondral cystic change. Labrum: Findings are suspicious for a labral tear involving the right hip (series 8, image 12 and series 12, image 11) if clinically indicated a dedicated MR arthrogram of the right hip may be helpful for further evaluation. Joint or bursal effusion Joint effusion:  None. Bursae:  Bilateral peritendinosis without bursitis. Muscles and tendons Muscles and tendons: The surrounding hip and pelvic musculature demonstrate normal signal intensity. No muscle tear, myositis, muscle lesion or significant fatty atrophy. The hamstring tendons are intact. Mild tendinopathy in the attachment region on the left. Other findings Miscellaneous: No significant intrapelvic abnormalities. There is a simple appearing cyst associated with the right ovary. No inguinal mass or hernia. IMPRESSION: 1. Patchy low T1 marrow signal appears stable since 2008 as discussed above. 2. Moderate hip joint degenerative changes for the patient's age. 3. Bilateral peritendinosis without findings for trochanteric bursitis. 4. An anterior superior labral tear of the right hip is suspected. 5. No significant intrapelvic abnormalities. Electronically Signed   By: Mamie Nick.  Gallerani M.D.   On: 04/04/2016 09:11    Assessment & Plan:   Problem List Items Addressed This Visit    Attention deficit hyperactivity disorder (ADHD), predominantly inattentive type    Refills on ritalin given,  She is using 2.5 mg twice daily /5 days per week.       Relevant Medications   methylphenidate (RITALIN) 5 MG tablet   methylphenidate (RITALIN) 5 MG tablet   Chronic right-sided low back pain    Unclear if symptoms are due to right hip DJD noted on 2017 MRI  or thoracic spinal stenosis from scoliosis.  Lumbar MRI was normal 2017.  ,  Continue  meloxicam , advised to add PPI to prevent gastritis       Relevant Medications   meloxicam (MOBIC) 7.5 MG tablet   Degenerative joint disease (DJD) of hip    Right sided,  With possible labral tear , moderate degenerative changes by MRI      Relevant Medications   meloxicam (MOBIC) 7.5 MG tablet      I am having Ms. Fromme start on omeprazole. I am also having her maintain her etonogestrel, valACYclovir, pantoprazole, Wheat Dextrin (BENEFIBER DRINK MIX PO), promethazine, montelukast, zolpidem, meloxicam, methylphenidate, methylphenidate, and methylphenidate.  Meds ordered this encounter  Medications  . meloxicam (MOBIC) 7.5 MG tablet    Sig: Take 7.5 mg by mouth daily.  . methylphenidate (RITALIN) 5 MG tablet    Sig: Take 1 tablet (5 mg total) by mouth daily.    Dispense:  30 tablet    Refill:  0    May refill on or after March 11 2017  . methylphenidate (RITALIN) 5 MG tablet    Sig: Take 1 tablet (5 mg total) by mouth daily.    Dispense:  30 tablet    Refill:  0    May refill on or after  February 09 2017  . methylphenidate (RITALIN) 5 MG tablet    Sig: Take 1 tablet (5 mg total) by mouth daily.    Dispense:  30 tablet    Refill:  0    May refill on or after Jan 09 2017  . omeprazole (PRILOSEC) 20 MG capsule    Sig: Take 1 capsule (20 mg total) by mouth daily.    Dispense:  30 capsule    Refill:  3    Medications Discontinued During This Encounter  Medication Reason  . methylphenidate (RITALIN) 5 MG tablet Reorder  . methylphenidate (RITALIN) 5 MG tablet Reorder  . methylphenidate (RITALIN) 5 MG tablet Reorder    Follow-up: Return in about 3 months (around 03/16/2017) for before August 15 .   Crecencio Mc, MD

## 2016-12-15 NOTE — Patient Instructions (Signed)
If you are going to remain on meloxicam daily,  I recommend protecting your stomach from gastritis by taking omeprazole 20 mg daily ,  I will send an rx to your pharmacy

## 2016-12-17 DIAGNOSIS — M169 Osteoarthritis of hip, unspecified: Secondary | ICD-10-CM | POA: Insufficient documentation

## 2016-12-17 DIAGNOSIS — G8929 Other chronic pain: Secondary | ICD-10-CM | POA: Insufficient documentation

## 2016-12-17 DIAGNOSIS — M545 Low back pain, unspecified: Secondary | ICD-10-CM | POA: Insufficient documentation

## 2016-12-17 NOTE — Assessment & Plan Note (Addendum)
Unclear if symptoms are due to right hip DJD noted on 2017 MRI  or thoracic spinal stenosis from scoliosis.  Lumbar MRI was normal 2017.  ,  Continue meloxicam , advised to add PPI to prevent gastritis

## 2016-12-17 NOTE — Assessment & Plan Note (Signed)
Right sided,  With possible labral tear , moderate degenerative changes by MRI

## 2016-12-17 NOTE — Assessment & Plan Note (Signed)
Refills on ritalin given,  She is using 2.5 mg twice daily /5 days per week.  

## 2016-12-22 ENCOUNTER — Encounter: Payer: Self-pay | Admitting: *Deleted

## 2016-12-25 ENCOUNTER — Ambulatory Visit
Admission: RE | Admit: 2016-12-25 | Discharge: 2016-12-25 | Disposition: A | Discharge status: Home / Self Care | Payer: BLUE CROSS/BLUE SHIELD | Source: Ambulatory Visit | Attending: Gastroenterology | Admitting: Gastroenterology

## 2016-12-25 ENCOUNTER — Other Ambulatory Visit: Payer: Self-pay | Admitting: Gastroenterology

## 2016-12-25 ENCOUNTER — Encounter: Payer: Self-pay | Admitting: *Deleted

## 2016-12-25 ENCOUNTER — Encounter
Admission: RE | Disposition: A | Discharge status: Home / Self Care | Payer: Self-pay | Source: Ambulatory Visit | Attending: Gastroenterology

## 2016-12-25 ENCOUNTER — Ambulatory Visit: Payer: BLUE CROSS/BLUE SHIELD | Admitting: Anesthesiology

## 2016-12-25 DIAGNOSIS — Z8371 Family history of colonic polyps: Secondary | ICD-10-CM | POA: Insufficient documentation

## 2016-12-25 DIAGNOSIS — R1013 Epigastric pain: Secondary | ICD-10-CM

## 2016-12-25 DIAGNOSIS — Z793 Long term (current) use of hormonal contraceptives: Secondary | ICD-10-CM | POA: Diagnosis not present

## 2016-12-25 DIAGNOSIS — Z1211 Encounter for screening for malignant neoplasm of colon: Secondary | ICD-10-CM | POA: Insufficient documentation

## 2016-12-25 DIAGNOSIS — K3189 Other diseases of stomach and duodenum: Secondary | ICD-10-CM | POA: Diagnosis not present

## 2016-12-25 DIAGNOSIS — K2210 Ulcer of esophagus without bleeding: Secondary | ICD-10-CM | POA: Diagnosis not present

## 2016-12-25 DIAGNOSIS — F909 Attention-deficit hyperactivity disorder, unspecified type: Secondary | ICD-10-CM | POA: Diagnosis not present

## 2016-12-25 DIAGNOSIS — Z79899 Other long term (current) drug therapy: Secondary | ICD-10-CM | POA: Diagnosis not present

## 2016-12-25 DIAGNOSIS — Z791 Long term (current) use of non-steroidal anti-inflammatories (NSAID): Secondary | ICD-10-CM | POA: Diagnosis not present

## 2016-12-25 DIAGNOSIS — K2960 Other gastritis without bleeding: Secondary | ICD-10-CM | POA: Diagnosis not present

## 2016-12-25 HISTORY — PX: ESOPHAGOGASTRODUODENOSCOPY (EGD) WITH PROPOFOL: SHX5813

## 2016-12-25 HISTORY — DX: Cervical disc disorder with radiculopathy, unspecified cervical region: M50.10

## 2016-12-25 HISTORY — DX: Raynaud's syndrome without gangrene: I73.00

## 2016-12-25 HISTORY — DX: Palpitations: R00.2

## 2016-12-25 HISTORY — DX: Major depressive disorder, single episode, unspecified: F32.9

## 2016-12-25 HISTORY — DX: Depression, unspecified: F32.A

## 2016-12-25 HISTORY — PX: COLONOSCOPY WITH PROPOFOL: SHX5780

## 2016-12-25 LAB — POCT PREGNANCY, URINE: PREG TEST UR: NEGATIVE

## 2016-12-25 LAB — HM COLONOSCOPY

## 2016-12-25 SURGERY — COLONOSCOPY WITH PROPOFOL
Anesthesia: General

## 2016-12-25 MED ORDER — PHENYLEPHRINE HCL 10 MG/ML IJ SOLN
INTRAMUSCULAR | Status: DC | PRN
  Administered 2016-12-25 (×3): 100 ug via INTRAVENOUS

## 2016-12-25 MED ORDER — LIDOCAINE 2% (20 MG/ML) 5 ML SYRINGE
INTRAMUSCULAR | Status: DC | PRN
  Administered 2016-12-25: 40 mg via INTRAVENOUS

## 2016-12-25 MED ORDER — PROPOFOL 10 MG/ML IV BOLUS
INTRAVENOUS | Status: DC | PRN
  Administered 2016-12-25: 100 mg via INTRAVENOUS

## 2016-12-25 MED ORDER — SODIUM CHLORIDE 0.9 % IV SOLN: INTRAVENOUS | Status: DC

## 2016-12-25 MED ORDER — FENTANYL CITRATE (PF) 100 MCG/2ML IJ SOLN
INTRAMUSCULAR | Status: DC | PRN
  Administered 2016-12-25 (×2): 50 ug via INTRAVENOUS

## 2016-12-25 MED ORDER — MIDAZOLAM HCL 5 MG/5ML IJ SOLN
INTRAMUSCULAR | Status: DC | PRN
  Administered 2016-12-25 (×2): 1 mg via INTRAVENOUS

## 2016-12-25 MED ORDER — MIDAZOLAM HCL 2 MG/2ML IJ SOLN
INTRAMUSCULAR | Status: AC
  Filled 2016-12-25: qty 2

## 2016-12-25 MED ORDER — PROPOFOL 500 MG/50ML IV EMUL
INTRAVENOUS | Status: AC
  Filled 2016-12-25: qty 50

## 2016-12-25 MED ORDER — FENTANYL CITRATE (PF) 100 MCG/2ML IJ SOLN
INTRAMUSCULAR | Status: AC
  Filled 2016-12-25: qty 2

## 2016-12-25 MED ORDER — SODIUM CHLORIDE 0.9 % IV SOLN
INTRAVENOUS | Status: DC
  Administered 2016-12-25 (×2): via INTRAVENOUS

## 2016-12-25 MED ORDER — PROPOFOL 500 MG/50ML IV EMUL
INTRAVENOUS | Status: DC | PRN
  Administered 2016-12-25: 140 ug/kg/min via INTRAVENOUS

## 2016-12-25 NOTE — Anesthesia Preprocedure Evaluation (Signed)
Anesthesia Evaluation  Patient identified by MRN, date of birth, ID band Patient awake    Reviewed: Allergy & Precautions, H&P , NPO status , Patient's Chart, lab work & pertinent test results, reviewed documented beta blocker date and time   History of Anesthesia Complications (+) PONV and history of anesthetic complications  Airway Mallampati: I  TM Distance: >3 FB Neck ROM: full    Dental  (+) Teeth Intact, Dental Advidsory Given   Pulmonary neg pulmonary ROS,           Cardiovascular Exercise Tolerance: Good negative cardio ROS       Neuro/Psych PSYCHIATRIC DISORDERS (Depression and Adhd) negative neurological ROS     GI/Hepatic negative GI ROS, Neg liver ROS,   Endo/Other  negative endocrine ROS  Renal/GU negative Renal ROS  negative genitourinary   Musculoskeletal   Abdominal   Peds  Hematology negative hematology ROS (+)   Anesthesia Other Findings Past Medical History: No date: ADD (attention deficit disorder) No date: Cervical disc disorder with radiculopathy of c* No date: Depression No date: Heart palpitations 2013: Raynaud's disease No date: Raynaud's syndrome without gangrene   Reproductive/Obstetrics negative OB ROS                             Anesthesia Physical Anesthesia Plan  ASA: II  Anesthesia Plan: General   Post-op Pain Management:    Induction:   Airway Management Planned:   Additional Equipment:   Intra-op Plan:   Post-operative Plan:   Informed Consent: I have reviewed the patients History and Physical, chart, labs and discussed the procedure including the risks, benefits and alternatives for the proposed anesthesia with the patient or authorized representative who has indicated his/her understanding and acceptance.   Dental Advisory Given  Plan Discussed with: Anesthesiologist, CRNA and Surgeon  Anesthesia Plan Comments:          Anesthesia Quick Evaluation

## 2016-12-25 NOTE — Anesthesia Post-op Follow-up Note (Cosign Needed)
Anesthesia QCDR form completed.        

## 2016-12-25 NOTE — Transfer of Care (Signed)
Immediate Anesthesia Transfer of Care Note  Patient: Kristi Spence  Procedure(s) Performed: Procedure(s): COLONOSCOPY WITH PROPOFOL (N/A) ESOPHAGOGASTRODUODENOSCOPY (EGD) WITH PROPOFOL (N/A)  Patient Location: PACU and Endoscopy Unit  Anesthesia Type:General  Level of Consciousness: sedated  Airway & Oxygen Therapy: Patient Spontanous Breathing and Patient connected to nasal cannula oxygen  Post-op Assessment: Report given to RN and Post -op Vital signs reviewed and stable  Post vital signs: Reviewed and stable  Last Vitals:  Vitals:   12/25/16 0749  BP: 123/71  Pulse: 88  Resp: 14  Temp: 36.8 C    Last Pain:  Vitals:   12/25/16 0749  TempSrc: Tympanic         Complications: No apparent anesthesia complications

## 2016-12-25 NOTE — Op Note (Addendum)
[facility] Gastroenterology Patient Name: Kristi Spence Procedure Date: 12/25/2016 8:23 AM MRN: [id number] Account #: [id number] Date of Birth: 04/13/1971 Admit Type: Outpatient Age: 46 Room: [facility] ENDO ROOM 3 Gender: Female Note Status: Finalized Procedure:            Colonoscopy Indications:          Family history of colonic polyps in a first-degree                        relative Providers:            Lollie Sails, MD Medicines:            Monitored Anesthesia Care Complications:        No immediate complications. Procedure:            Pre-Anesthesia Assessment:                       - ASA Grade Assessment: II - A patient with mild                        systemic disease.                       After obtaining informed consent, the colonoscope was                        passed under direct vision. Throughout the procedure,                        the patient's blood pressure, pulse, and oxygen                        saturations were monitored continuously. The                        Colonoscope was introduced through the anus and                        advanced to the the cecum, identified by appendiceal                        orifice and ileocecal valve. The colonoscopy was                        unusually difficult due to a tortuous colon. Successful                        completion of the procedure was aided by using manual                        pressure. The patient tolerated the procedure well. The                        quality of the bowel preparation was good. Findings:      The colon (entire examined portion) appeared normal.      The retroflexed view of the distal rectum and anal verge was normal and       showed no anal or rectal abnormalities.      The digital rectal exam was normal. Impression:           -  The entire examined colon is normal.                       - The distal rectum and anal verge are normal on   retroflexion view.                       - No specimens collected. Recommendation:       - Discharge patient to home. Procedure Code(s):    --- Professional ---                       [phone], Colonoscopy, flexible; diagnostic, including                        collection of specimen(s) by brushing or washing, when                        performed (separate procedure) Diagnosis Code(s):    --- Professional ---                       Z83.71, Family history of colonic polyps CPT copyright 2016 American Medical Association. All rights reserved. The codes documented in this report are preliminary and upon coder review may  be revised to meet current compliance requirements. Lollie Sails, MD 12/25/2016 9:37:57 AM This report has been signed electronically. Number of Addenda: 0 Note Initiated On: 12/25/2016 8:23 AM Scope Withdrawal Time: 0 hours 6 minutes 56 seconds  Total Procedure Duration: 0 hours 20 minutes 28 seconds       [facility]

## 2016-12-25 NOTE — Anesthesia Postprocedure Evaluation (Signed)
Anesthesia Post Note  Patient: Kristi Spence  Procedure(s) Performed: Procedure(s) (LRB): COLONOSCOPY WITH PROPOFOL (N/A) ESOPHAGOGASTRODUODENOSCOPY (EGD) WITH PROPOFOL (N/A)  Patient location during evaluation: Endoscopy Anesthesia Type: General Level of consciousness: awake and alert Pain management: pain level controlled Vital Signs Assessment: post-procedure vital signs reviewed and stable Respiratory status: spontaneous breathing, nonlabored ventilation, respiratory function stable and patient connected to nasal cannula oxygen Cardiovascular status: blood pressure returned to baseline and stable Postop Assessment: no signs of nausea or vomiting Anesthetic complications: no     Last Vitals:  Vitals:   12/25/16 1000 12/25/16 1010  BP: 109/83 106/75  Pulse: 70 64  Resp: 19 18  Temp:      Last Pain:  Vitals:   12/25/16 0940  TempSrc: Tympanic                 Martha Clan

## 2016-12-25 NOTE — H&P (Signed)
Outpatient short stay form Pre-procedure 12/25/2016 8:33 AM Lollie Sails MD  Primary Physician: Dr. Deborra Medina, Dr. Benjaman Kindler  Reason for visit:  EGD and colonoscopy  History of present illness:  Patient is a 46 year old female presenting today as above. She has personal history of epigastric pain and bloating that occurs within a half an hour after eating may last for number hours afterwards. She has had a celiac panel that was negative. She has a family history of colon polyps in both parents. It is of note that she has a B12 deficiency. She tolerated her prep well. She takes no aspirin or blood thinning agents.    Current Facility-Administered Medications:  .  0.9 %  sodium chloride infusion, , Intravenous, Continuous, Lollie Sails, MD, Last Rate: 20 mL/hr at 12/25/16 3546 .  0.9 %  sodium chloride infusion, , Intravenous, Continuous, Lollie Sails, MD  Prescriptions Prior to Admission  Medication Sig Dispense Refill Last Dose  . polyethylene glycol powder (GLYCOLAX/MIRALAX) powder Take 1 Container by mouth once.     Marland Kitchen zolpidem (AMBIEN) 10 MG tablet TAKE ONE TABLET AT BEDTIME AS NEEDED 30 tablet 0 Past Week at Unknown time  . etonogestrel (NEXPLANON) 68 MG IMPL implant Inject into the skin.   Taking  . meloxicam (MOBIC) 7.5 MG tablet Take 7.5 mg by mouth daily.   12/20/2016  . methylphenidate (RITALIN) 5 MG tablet Take 1 tablet (5 mg total) by mouth daily. 30 tablet 0 12/22/2016  . methylphenidate (RITALIN) 5 MG tablet Take 1 tablet (5 mg total) by mouth daily. 30 tablet 0 12/22/2016  . methylphenidate (RITALIN) 5 MG tablet Take 1 tablet (5 mg total) by mouth daily. 30 tablet 0 12/22/2016  . montelukast (SINGULAIR) 10 MG tablet Take 1 tablet (10 mg total) by mouth at bedtime. (Patient not taking: Reported on 12/25/2016) 30 tablet 3 Completed Course at Unknown time  . omeprazole (PRILOSEC) 20 MG capsule Take 1 capsule (20 mg total) by mouth daily. (Patient not taking:  Reported on 12/25/2016) 30 capsule 3 Completed Course at Unknown time  . pantoprazole (PROTONIX) 40 MG tablet Take 1 tablet by mouth daily.   Completed Course at Unknown time  . promethazine (PHENERGAN) 25 MG suppository Place 1 suppository (25 mg total) rectally every 6 (six) hours as needed for nausea or vomiting. (Patient not taking: Reported on 12/25/2016) 12 each 0 Completed Course at Unknown time  . valACYclovir (VALTREX) 500 MG tablet TAKE ONE TABLET BY MOUTH DAILY (Patient not taking: Reported on 12/25/2016) 30 tablet 5 Completed Course at Unknown time  . Wheat Dextrin (BENEFIBER DRINK MIX PO) Take by mouth.   Taking     Allergies  Allergen Reactions  . Adhesive [Tape] Dermatitis  . Doxepin Other (See Comments)    Over sedation.  Marland Kitchen Penicillins Hives  . Sulfa Antibiotics Hives  . Other Rash    Steri strips     Past Medical History:  Diagnosis Date  . ADD (attention deficit disorder)   . Cervical disc disorder with radiculopathy of cervical region   . Depression   . Heart palpitations   . Raynaud's disease 2013  . Raynaud's syndrome without gangrene     Review of systems:      Physical Exam    Heart and lungs: Regular rate and rhythm without rub or gallop, lungs are bilaterally clear.    HEENT: Normocephalic atraumatic eyes are anicteric    Other:     Pertinant exam for procedure:  Soft nontender nondistended bowel sounds positive normoactive.    Planned proceedures: EGD, colonoscopy and indicated procedures. I have discussed the risks benefits and complications of procedures to include not limited to bleeding, infection, perforation and the risk of sedation and the patient wishes to proceed.    Lollie Sails, MD Gastroenterology 12/25/2016  8:33 AM

## 2016-12-25 NOTE — Op Note (Addendum)
[facility] Gastroenterology Patient Name: Kristi Spence Procedure Date: 12/25/2016 8:23 AM MRN: [id number] Account #: [id number] Date of Birth: 06-04-1971 Admit Type: Outpatient Age: 46 Room: [facility] ENDO ROOM 3 Gender: Female Note Status: Finalized Procedure:            Upper GI endoscopy Indications:          Epigastric abdominal pain, Dyspepsia Providers:            Lollie Sails, MD Medicines:            Monitored Anesthesia Care Complications:        No immediate complications. Procedure:            Pre-Anesthesia Assessment:                       - ASA Grade Assessment: II - A patient with mild                        systemic disease.                       After obtaining informed consent, the endoscope was                        passed under direct vision. Throughout the procedure,                        the patient's blood pressure, pulse, and oxygen                        saturations were monitored continuously. The Endoscope                        was introduced through the mouth, and advanced to the                        third part of duodenum. The upper GI endoscopy was                        accomplished without difficulty. The patient tolerated                        the procedure well. Findings:      LA Grade A (one or more mucosal breaks less than 5 mm, not extending       between tops of 2 mucosal folds) esophagitis with no bleeding was found.       Biopsies were taken with a cold forceps for histology.      The exam of the esophagus was otherwise normal.      Patchy mild inflammation characterized by congestion (edema), erosions       and erythema was found in the gastric body and in the gastric antrum.       Biopsies were taken with a cold forceps for histology.      Diffuse mild mucosal variance characterized by smoothness was found in       the entire duodenum. Biopsies were taken with a cold forceps for       histology.      A  single 3 mm sessile polyp with no bleeding and no stigmata of recent       bleeding was found on the  posterior wall of the gastric body. Biopsies       were taken with a cold forceps for histology. Impression:           - LA Grade A erosive esophagitis. Biopsied.                       - Bile erosive gastritis. Biopsied.                       - Mucosal variant in the duodenum. Biopsied. Recommendation:       - Use Protonix (pantoprazole) 40 mg PO daily daily.                       - Await pathology results.                       - Return to GI clinic in 1 month.                       - consider trial of carafate for continued symptoms                       - consider evaluating the gallbladder with [place] and                        HIDA/CCK, and CT abdomen with contrast if negative to                        rule out SMA syndrome                       - Perform a colonoscopy today. Procedure Code(s):    --- Professional ---                       [phone], Esophagogastroduodenoscopy, flexible, transoral;                        with biopsy, single or multiple Diagnosis Code(s):    --- Professional ---                       K20.8, Other esophagitis                       K29.60, Other gastritis without bleeding                       K31.89, Other diseases of stomach and duodenum                       R10.13, Epigastric pain CPT copyright 2016 American Medical Association. All rights reserved. The codes documented in this report are preliminary and upon coder review may  be revised to meet current compliance requirements. Lollie Sails, MD 12/25/2016 9:11:11 AM This report has been signed electronically. Number of Addenda: 0 Note Initiated On: 12/25/2016 8:23 AM      [facility]

## 2016-12-26 ENCOUNTER — Encounter: Payer: Self-pay | Admitting: Gastroenterology

## 2016-12-26 LAB — SURGICAL PATHOLOGY

## 2017-01-23 ENCOUNTER — Ambulatory Visit
Admission: RE | Admit: 2017-01-23 | Discharge: 2017-01-23 | Disposition: A | Discharge status: Home / Self Care | Payer: BLUE CROSS/BLUE SHIELD | Source: Ambulatory Visit | Attending: Gastroenterology | Admitting: Gastroenterology

## 2017-01-23 ENCOUNTER — Other Ambulatory Visit
Admission: RE | Admit: 2017-01-23 | Discharge: 2017-01-23 | Disposition: A | Discharge status: Home / Self Care | Payer: BLUE CROSS/BLUE SHIELD | Source: Ambulatory Visit | Attending: Gastroenterology | Admitting: Gastroenterology

## 2017-01-23 ENCOUNTER — Encounter
Admission: RE | Admit: 2017-01-23 | Discharge: 2017-01-23 | Disposition: A | Discharge status: Home / Self Care | Payer: BLUE CROSS/BLUE SHIELD | Source: Ambulatory Visit | Attending: Gastroenterology | Admitting: Gastroenterology

## 2017-01-23 DIAGNOSIS — K829 Disease of gallbladder, unspecified: Secondary | ICD-10-CM | POA: Insufficient documentation

## 2017-01-23 DIAGNOSIS — R1013 Epigastric pain: Secondary | ICD-10-CM

## 2017-01-23 DIAGNOSIS — Z3200 Encounter for pregnancy test, result unknown: Secondary | ICD-10-CM | POA: Diagnosis present

## 2017-01-23 LAB — PREGNANCY, URINE: PREG TEST UR: NEGATIVE

## 2017-01-23 MED ORDER — TECHNETIUM TC 99M MEBROFENIN IV KIT
5.0000 | PACK | Freq: Once | INTRAVENOUS | Status: AC | PRN
  Administered 2017-01-23: 5.18 via INTRAVENOUS

## 2017-03-23 ENCOUNTER — Other Ambulatory Visit: Payer: Self-pay | Admitting: Obstetrics and Gynecology

## 2017-03-23 ENCOUNTER — Encounter: Payer: Self-pay | Admitting: Internal Medicine

## 2017-03-23 DIAGNOSIS — Z1231 Encounter for screening mammogram for malignant neoplasm of breast: Secondary | ICD-10-CM

## 2017-03-26 ENCOUNTER — Telehealth: Payer: Self-pay | Admitting: Internal Medicine

## 2017-03-26 ENCOUNTER — Ambulatory Visit (INDEPENDENT_AMBULATORY_CARE_PROVIDER_SITE_OTHER): Payer: BLUE CROSS/BLUE SHIELD | Admitting: Internal Medicine

## 2017-03-26 ENCOUNTER — Encounter: Payer: Self-pay | Admitting: Internal Medicine

## 2017-03-26 DIAGNOSIS — F411 Generalized anxiety disorder: Secondary | ICD-10-CM

## 2017-03-26 DIAGNOSIS — F430 Acute stress reaction: Secondary | ICD-10-CM

## 2017-03-26 DIAGNOSIS — Z6372 Alcoholism and drug addiction in family: Secondary | ICD-10-CM

## 2017-03-26 DIAGNOSIS — Z638 Other specified problems related to primary support group: Secondary | ICD-10-CM

## 2017-03-26 MED ORDER — SERTRALINE HCL 50 MG PO TABS: 50.0000 mg | ORAL_TABLET | Freq: Every day | ORAL | 3 refills | Status: DC

## 2017-03-26 NOTE — Patient Instructions (Signed)
Ask the couples therapist to give him the CAGE questionaire  +

## 2017-03-26 NOTE — Progress Notes (Signed)
Subjective:  Patient ID: Kristi Spence, female    DOB: [birth year]  Age: 46 y.o. MRN: [id number]  CC: Diagnoses of Anxiety as acute reaction to exceptional stress and Spouse of alcoholic were pertinent to this visit.  HPI Kristi Spence presents for management of uncontrolled anxiety . Patient has recently separated from her husband of 20 years due to his refusal to address his untreated alcoholism.  She is angry with herself for giving him a third chance,  And angry with him for his dishonesty and lack of resolve.  She has two teenage sons who have been put in harm's way by his driving while intoxicated.   She  Has reduced her use of Ritalin since her anxiety has increased,  But finds herself increasingly angry when her concentration and productivity are disrupted by interruptions from her husband which occur daily with phone calls .  She has had no emotional support from his family.  There is a strong FH of addiction in her husband's family.  She attended several Al-Anon meetings but was discouraged and repelled by the physical and and intellectual characteristics of the  other attendees, so she has not returned. She is attending individual therapy with a psychologist and has been urged by friends and by her husband to attend couples therapy, but has no intention of salvaging the relationship unless her husband addresses his alcohol addiction.     Patient grew up with her mother and step father after her parents divorced,  She notes that her stepfather drank alcohol to excess  Patient does not drink alcohol.  She is an avid runner .  She feels unmotivated to socialize  And is normally very outgoing and gregarious.  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   She is not suicidal and although she is  angry at herself,  She does not endorse feelings of worthlessness.  She has taken Lexapro in the past and feels she needs medication but not sure if she wants to resume Lexapro due to fear of decreaesed motivation.      Outpatient Medications Prior to Visit  Medication Sig Dispense Refill  . etonogestrel (NEXPLANON) 68 MG IMPL implant Inject into the skin.    . meloxicam (MOBIC) 7.5 MG tablet Take 7.5 mg by mouth daily.    . pantoprazole (PROTONIX) 40 MG tablet Take 1 tablet by mouth daily.    . valACYclovir (VALTREX) 500 MG tablet TAKE ONE TABLET BY MOUTH DAILY 30 tablet 5  . methylphenidate (RITALIN) 5 MG tablet Take 1 tablet (5 mg total) by mouth daily. (Patient not taking: Reported on 03/26/2017) 30 tablet 0  . promethazine (PHENERGAN) 25 MG suppository Place 1 suppository (25 mg total) rectally every 6 (six) hours as needed for nausea or vomiting. (Patient not taking: Reported on 12/25/2016) 12 each 0  . methylphenidate (RITALIN) 5 MG tablet Take 1 tablet (5 mg total) by mouth daily. (Patient not taking: Reported on 03/26/2017) 30 tablet 0  . methylphenidate (RITALIN) 5 MG tablet Take 1 tablet (5 mg total) by mouth daily. 30 tablet 0  . montelukast (SINGULAIR) 10 MG tablet Take 1 tablet (10 mg total) by mouth at bedtime. (Patient not taking: Reported on 12/25/2016) 30 tablet 3  . omeprazole (PRILOSEC) 20 MG capsule Take 1 capsule (20  mg total) by mouth daily. (Patient not taking: Reported on 12/25/2016) 30 capsule 3  . polyethylene glycol powder (GLYCOLAX/MIRALAX) powder Take 1 Container by mouth once.    . Wheat Dextrin (BENEFIBER DRINK MIX PO) Take by mouth.    . zolpidem (AMBIEN) 10 MG tablet TAKE ONE TABLET AT BEDTIME AS NEEDED 30 tablet 0   No facility-administered medications prior to visit.     Review of Systems;  Patient denies headache, fevers, malaise, unintentional weight loss, skin rash, eye pain, sinus congestion and sinus pain, sore throat, dysphagia,  hemoptysis , cough,  dyspnea, wheezing, chest pain, palpitations, orthopnea, edema, abdominal pain, nausea, melena, diarrhea, constipation, flank pain, dysuria, hematuria, urinary  Frequency, nocturia, numbness, tingling, seizures,  Focal weakness, Loss of consciousness,  Tremor, and insomnia.     Objective:  BP 100/70   Pulse 95   Temp 98.7 F (37.1 C) (Oral)   Resp 16   Ht 5\' 5"  (1.651 m)   Wt 120 lb 12.8 oz (54.8 kg)   SpO2 99%   BMI 20.10 kg/m   BP Readings from Last 3 Encounters:  03/26/17 100/70  12/25/16 106/75  12/15/16 106/68    Wt Readings from Last 3 Encounters:  03/26/17 120 lb 12.8 oz (54.8 kg)  12/25/16 122 lb (55.3 kg)  12/15/16 122 lb 6.4 oz (55.5 kg)    General appearance: alert, cooperative and appears stated age Lungs: clear to auscultation bilaterally Heart: regular rate and rhythm, S1, S2 normal, no murmur, click, rub or gallop Psych: affect normal, makes good eye contact. No fidgeting,  Smiles easily.  Denies suicidal thoughts    No results found for: HGBA1C  Lab Results  Component Value Date   CREATININE 0.67 02/14/2016   CREATININE 0.8 04/01/2014   CREATININE 0.8 07/28/2011    Lab Results  Component Value Date   WBC 6.6 02/14/2016   HGB 14.4 02/14/2016   HCT 42.8 02/14/2016   PLT 216.0 02/14/2016   GLUCOSE 80 02/14/2016   CHOL 189 02/14/2016   TRIG 67.0 02/14/2016   HDL 69.70 02/14/2016   LDLCALC 106 (H) 02/14/2016   ALT 24 02/14/2016   AST 26 02/14/2016   NA 138 02/14/2016   K 4.1 02/14/2016   CL 102 02/14/2016   CREATININE 0.67 02/14/2016   BUN 11 02/14/2016   CO2 27 02/14/2016   TSH 0.61 02/14/2016    US Abdomen Complete  Result Date: 01/23/2017 CLINICAL DATA:  Chronic postprandial epigastric pain for several years. EXAM: ABDOMEN ULTRASOUND COMPLETE COMPARISON:  None. FINDINGS: Gallbladder: No gallstones or wall thickening visualized. No sonographic Murphy sign noted by sonographer. Common bile duct: Diameter: 3 mm, within normal limits. Liver:  No focal lesion identified. Within normal limits in parenchymal echogenicity. IVC: No abnormality visualized. Pancreas: Visualized portion unremarkable. Spleen: Size and appearance within normal limits. Right Kidney: Length: 10.9 cm. Echogenicity within normal limits. No mass or hydronephrosis visualized. Left Kidney: Length: 10.9 cm. Echogenicity within normal limits. No mass or hydronephrosis visualized. Abdominal aorta: No aneurysm visualized. Other findings: None. IMPRESSION: Negative abdominal ultrasound. Electronically Signed   By: Earle Gell M.D.   On: 01/23/2017 09:46   Nm Hepato W/eject Fract  Result Date: 01/23/2017 CLINICAL DATA:  Epigastric discomfort worsening after eating. Nausea and constipation. EXAM: NUCLEAR MEDICINE HEPATOBILIARY IMAGING WITH GALLBLADDER EF TECHNIQUE: Sequential images of the abdomen were obtained out to 60 minutes following intravenous administration of radiopharmaceutical. After oral ingestion of Ensure, gallbladder ejection fraction was determined. At 60 min, normal ejection  fraction is greater than 33%. RADIOPHARMACEUTICALS:  5.2 mCi Tc-84m  Choletec IV COMPARISON:  Abdominal ultrasound 01/23/2017 FINDINGS: There is satisfactory uptake of radiopharmaceutical from the blood pool. Biliary activity at 7 minutes. Bowel activity at 10 minutes. Gallbladder activity at 12 minutes. The patient experienced 1/10 epigastric tightness upon drinking the Ensure meal. Calculated gallbladder ejection fraction is 28%. (Normal gallbladder ejection fraction with Ensure is greater than 33%.) IMPRESSION: 1. Gallbladder dysfunction, with gallbladder ejection fraction of 28% at 1 hour. This should normally be greater than 33%. 2. Mild abdominal discomfort upon drinking the Ensure meal. Electronically Signed   By: Van Clines M.D.   On: 01/23/2017 12:42    Assessment & Plan:   Problem List Items Addressed This Visit    Anxiety as acute reaction to exceptional stress    Recommended  trial of generic zoloft .  Start with 25 mg daily x 4 days,  Then increase to 50 mg daily . Follow up one month.  Will consider adding buspirone if needed, vs low dose propranolol for social settings which result in increased sympathetic activity leading to stage fright. .       Relevant Medications   sertraline (ZOLOFT) 50 MG tablet   Spouse of alcoholic    Patient did not appreciate any input from [place] but is attending individual counselling and has addressed any behaviors that could be considered codepenent or enabling.          I have discontinued Ms. Flis's Wheat Dextrin (BENEFIBER DRINK MIX PO), montelukast, zolpidem, omeprazole, and polyethylene glycol powder. I am also having her start on sertraline. Additionally, I am having her maintain her etonogestrel, valACYclovir, pantoprazole, promethazine, meloxicam, and methylphenidate.  A total of 40 minutes was spent with patient more than half of which was spent in counseling patient on the above mentioned issues , and coordination of care.  Meds ordered this encounter  Medications  . sertraline (ZOLOFT) 50 MG tablet    Sig: Take 1 tablet (50 mg total) by mouth daily.    Dispense:  30 tablet    Refill:  3    Medications Discontinued During This Encounter  Medication Reason  . montelukast (SINGULAIR) 10 MG tablet No longer needed (for PRN medications)  . methylphenidate (RITALIN) 5 MG tablet Duplicate  . omeprazole (PRILOSEC) 20 MG capsule No longer needed (for PRN medications)  . methylphenidate (RITALIN) 5 MG tablet No longer needed (for PRN medications)  . polyethylene glycol powder (GLYCOLAX/MIRALAX) powder No longer needed (for PRN medications)  . Wheat Dextrin (BENEFIBER DRINK MIX PO) No longer needed (for PRN medications)  . zolpidem (AMBIEN) 10 MG tablet No longer needed (for PRN medications)    Follow-up: No Follow-up on file.   Crecencio Mc, MD

## 2017-03-27 DIAGNOSIS — F430 Acute stress reaction: Principal | ICD-10-CM

## 2017-03-27 DIAGNOSIS — Z6372 Alcoholism and drug addiction in family: Secondary | ICD-10-CM | POA: Insufficient documentation

## 2017-03-27 DIAGNOSIS — F411 Generalized anxiety disorder: Secondary | ICD-10-CM | POA: Insufficient documentation

## 2017-03-27 NOTE — Assessment & Plan Note (Addendum)
Recommended trial of generic zoloft .  Start with 25 mg daily x 4 days,  Then increase to 50 mg daily . Follow up one month.  Will consider adding buspirone if needed, vs low dose propranolol for social settings which result in increased sympathetic activity leading to stage fright. Marland Kitchen

## 2017-03-27 NOTE — Assessment & Plan Note (Signed)
Patient did not appreciate any input from [place] but is attending individual counselling and has addressed any behaviors that could be considered codepenent or enabling.

## 2017-04-02 ENCOUNTER — Ambulatory Visit: Payer: BLUE CROSS/BLUE SHIELD | Admitting: Internal Medicine

## 2017-04-04 ENCOUNTER — Ambulatory Visit: Payer: Self-pay | Admitting: Internal Medicine

## 2017-04-17 ENCOUNTER — Ambulatory Visit: Payer: BLUE CROSS/BLUE SHIELD | Admitting: Psychology

## 2017-04-19 ENCOUNTER — Ambulatory Visit
Admission: RE | Admit: 2017-04-19 | Discharge: 2017-04-19 | Disposition: A | Discharge status: Home / Self Care | Payer: BLUE CROSS/BLUE SHIELD | Source: Ambulatory Visit | Attending: Obstetrics and Gynecology | Admitting: Obstetrics and Gynecology

## 2017-04-19 DIAGNOSIS — Z1231 Encounter for screening mammogram for malignant neoplasm of breast: Secondary | ICD-10-CM | POA: Diagnosis not present

## 2017-05-23 NOTE — Telephone Encounter (Signed)
Error

## 2017-07-06 ENCOUNTER — Telehealth: Payer: Self-pay | Admitting: Internal Medicine

## 2017-07-06 DIAGNOSIS — F900 Attention-deficit hyperactivity disorder, predominantly inattentive type: Secondary | ICD-10-CM

## 2017-07-06 MED ORDER — METHYLPHENIDATE HCL 5 MG PO TABS: 5.0000 mg | ORAL_TABLET | Freq: Every day | ORAL | 0 refills | Status: DC

## 2017-07-06 NOTE — Telephone Encounter (Signed)
Copied from [place] [phone]. Topic: Quick Communication - See Telephone Encounter >> Jul 06, 2017 11:12 AM Burnis Medin, NT wrote: CRM for notification. See Telephone encounter for: Pt. Called in about getting a refill on methylphenidate (RITALIN) 5 MG tablet. Pt. Wants to pick up the prescription up today.  07/06/17.

## 2017-07-06 NOTE — Telephone Encounter (Signed)
Last Fill 9/18 and last OV 7/18 ok to fill?

## 2017-07-06 NOTE — Telephone Encounter (Signed)
Refills for 3 months of ADD medication printed. Needs appt in Feb

## 2017-07-06 NOTE — Telephone Encounter (Signed)
Scripts ready and placed at front desk patient aware.

## 2017-07-18 ENCOUNTER — Telehealth: Payer: Self-pay

## 2017-07-18 NOTE — Telephone Encounter (Signed)
Would it be okay to schedule pt in a 11:30am or 4:30pm spot next week?

## 2017-07-18 NOTE — Telephone Encounter (Signed)
Copied from [place]. Topic: Appointment Scheduling - Scheduling Inquiry for Clinic >> Jul 18, 2017  9:39 AM Conception Chancy, NT wrote: Reason for CRM: pt states she has spoke with Dr. Derrel Nip before about Ranos, and she states it is getting worse and would like to be seen. Dr. Derrel Nip is booked out and would like to be seen. Please see if you can fit pt in and give her a call.

## 2017-07-18 NOTE — Telephone Encounter (Addendum)
YES  BUT ITS THE COLD WEATHER THAT MAY BE AGGRAVATING IT,   TELL HER THAT SHE NEEDS TO KEEP POCKET WARMERS IN HER COAT POCKETS AND WEAR MITTENS WHEN SHE IS OUTSIDE WHEN THE TEMP IS < 50.

## 2017-07-23 ENCOUNTER — Other Ambulatory Visit: Payer: Self-pay | Admitting: Internal Medicine

## 2017-07-23 NOTE — Telephone Encounter (Signed)
Patient has been scheduled for 11/28 at 4:30

## 2017-07-23 NOTE — Telephone Encounter (Signed)
Will you please schedule pt for November 28th at 4:30 per Dr. Derrel Nip.

## 2017-07-25 ENCOUNTER — Encounter: Payer: Self-pay | Admitting: Internal Medicine

## 2017-07-25 ENCOUNTER — Ambulatory Visit (INDEPENDENT_AMBULATORY_CARE_PROVIDER_SITE_OTHER): Payer: BLUE CROSS/BLUE SHIELD | Admitting: Internal Medicine

## 2017-07-25 VITALS — BP 110/66 | HR 92 | Temp 99.0°F | Resp 15 | Ht 65.0 in | Wt 122.8 lb

## 2017-07-25 DIAGNOSIS — I7300 Raynaud's syndrome without gangrene: Secondary | ICD-10-CM | POA: Diagnosis not present

## 2017-07-25 DIAGNOSIS — R202 Paresthesia of skin: Secondary | ICD-10-CM

## 2017-07-25 DIAGNOSIS — R200 Anesthesia of skin: Secondary | ICD-10-CM

## 2017-07-25 MED ORDER — AMLODIPINE BESYLATE 2.5 MG PO TABS: 2.5000 mg | ORAL_TABLET | Freq: Every day | ORAL | 3 refills | Status: DC

## 2017-07-25 NOTE — Progress Notes (Signed)
Subjective:  Patient ID: Kristi Spence, female    DOB: 1970/12/01  Age: 46 y.o. MRN: [id number]  CC: The primary encounter diagnosis was Numbness and tingling in left hand. A diagnosis of Raynaud's phenomenon without gangrene was also pertinent to this visit.  HPI Kristi Spence presents for evaluation of pain and blanching of the fingers of both hands.  The reaction has been occurring whenever her hands are exposed to temperatures  < 60 degrees, and has been accompanied by pain.  She has been told in the past that she has Raynaud's ,  But she feels that the symptoms are getting worse.  No prior use of medications due to baseline low   Blood pressure.  fingers turn red,,  Then white then bluish white.  Painful.     Also notes that her left hand goes numb when she exercises on the elliptiglider.   Outpatient Medications Prior to Visit  Medication Sig Dispense Refill  . etonogestrel (NEXPLANON) 68 MG IMPL implant Inject into the skin.    . meloxicam (MOBIC) 7.5 MG tablet Take 7.5 mg by mouth daily.    . methylphenidate (RITALIN) 5 MG tablet Take 1 tablet (5 mg total) daily by mouth. 30 tablet 0  . sertraline (ZOLOFT) 50 MG tablet TAKE 1 TABLET BY MOUTH EVERY DAY 30 tablet 1  . valACYclovir (VALTREX) 500 MG tablet TAKE ONE TABLET BY MOUTH DAILY 30 tablet 5  . promethazine (PHENERGAN) 25 MG suppository Place 1 suppository (25 mg total) rectally every 6 (six) hours as needed for nausea or vomiting. (Patient not taking: Reported on 12/25/2016) 12 each 0  . pantoprazole (PROTONIX) 40 MG tablet Take 1 tablet by mouth daily.     No facility-administered medications prior to visit.     Review of Systems;  Patient denies headache, fevers, malaise, unintentional weight loss, skin rash, eye pain, sinus congestion and sinus pain, sore throat, dysphagia,  hemoptysis , cough, dyspnea, wheezing, chest pain, palpitations, orthopnea, edema, abdominal pain, nausea, melena, diarrhea,  constipation, flank pain, dysuria, hematuria, urinary  Frequency, nocturia,  seizures,  Focal weakness, Loss of consciousness,  Tremor, insomnia, depression, anxiety, and suicidal ideation.      Objective:  BP 110/66 (BP Location: Left Arm, Patient Position: Sitting, Cuff Size: Normal)   Pulse 92   Temp 99 F (37.2 C) (Oral)   Resp 15   Ht 5' 5"  (1.651 m)   Wt 122 lb 12.8 oz (55.7 kg)   SpO2 98%   BMI 20.43 kg/m   BP Readings from Last 3 Encounters:  07/25/17 110/66  03/26/17 100/70  12/25/16 106/75    Wt Readings from Last 3 Encounters:  07/25/17 122 lb 12.8 oz (55.7 kg)  03/26/17 120 lb 12.8 oz (54.8 kg)  12/25/16 122 lb (55.3 kg)    General appearance: alert, cooperative and appears stated age Ears: normal TM's and external ear canals both ears Throat: lips, mucosa, and tongue normal; teeth and gums normal Neck: no adenopathy, no carotid bruit, supple, symmetrical, trachea midline and thyroid not enlarged, symmetric, no tenderness/mass/nodules Back: symmetric, no curvature. ROM normal. No CVA tenderness. Lungs: clear to auscultation bilaterally Heart: regular rate and rhythm, S1, S2 normal, no murmur, click, rub or gallop Abdomen: soft, non-tender; bowel sounds normal; no masses,  no organomegaly Pulses: 2+ and symmetric Skin: Skin color, texture, turgor normal. No rashes or lesions Lymph nodes: Cervical, supraclavicular, and axillary nodes normal.  No results found for: HGBA1C  Lab Results  Component  Value Date   CREATININE 0.67 02/14/2016   CREATININE 0.8 04/01/2014   CREATININE 0.8 07/28/2011    Lab Results  Component Value Date   WBC 6.6 02/14/2016   HGB 14.4 02/14/2016   HCT 42.8 02/14/2016   PLT 216.0 02/14/2016   GLUCOSE 80 02/14/2016   CHOL 189 02/14/2016   TRIG 67.0 02/14/2016   HDL 69.70 02/14/2016   LDLCALC 106 (H) 02/14/2016   ALT 24 02/14/2016   AST 26 02/14/2016   NA 138 02/14/2016   K 4.1 02/14/2016   CL 102 02/14/2016   CREATININE  0.67 02/14/2016   BUN 11 02/14/2016   CO2 27 02/14/2016   TSH 0.61 02/14/2016    Mm Digital Screening Bilateral  Result Date: 04/19/2017 CLINICAL DATA:  Screening. EXAM: DIGITAL SCREENING BILATERAL MAMMOGRAM WITH CAD COMPARISON:  Previous exam(s). ACR Breast Density Category d: The breast tissue is extremely dense, which lowers the sensitivity of mammography. FINDINGS: There are no findings suspicious for malignancy. Images were processed with CAD. IMPRESSION: No mammographic evidence of malignancy. A result letter of this screening mammogram will be mailed directly to the patient. RECOMMENDATION: Screening mammogram in one year. (Code:SM-B-01Y) BI-RADS CATEGORY  1: Negative. Electronically Signed   By: Abelardo Diesel M.D.   On: 04/19/2017 12:28    Assessment & Plan:   Problem List Items Addressed This Visit    Raynaud phenomenon    She is requesting referral to rheumatologist at [facility] for work up. Preliminary labs to rule out other potentional diagnoses, including vasculitis and neuropathy, are in progress .recommend a  trial of amlodipine 2.5 mg       Relevant Orders   Sedimentation rate   TSH   CBC with Differential/Platelet   HLA-B27 antigen   ANA+ENA+DNA/DS+Scl 70+SjoSSA/B   Ambulatory referral to Rheumatology    Other Visit Diagnoses    Numbness and tingling in left hand    -  Primary   Relevant Orders   NCV with EMG(electromyography)    A total of 25 minutes of face to face time was spent with patient more than half of which was spent in counselling about the above mentioned conditions  and coordination of care   I have discontinued Kristi Spence's pantoprazole. I am also having her start on amLODipine. Additionally, I am having her maintain her etonogestrel, valACYclovir, promethazine, meloxicam, methylphenidate, and sertraline.  Meds ordered this encounter  Medications  . amLODipine (NORVASC) 2.5 MG tablet    Sig: Take 1 tablet (2.5 mg total) by mouth daily.     Dispense:  30 tablet    Refill:  3    Medications Discontinued During This Encounter  Medication Reason  . pantoprazole (PROTONIX) 40 MG tablet Patient has not taken in last 30 days    Follow-up: No Follow-up on file.   Crecencio Mc, MD

## 2017-07-25 NOTE — Patient Instructions (Addendum)
Referral to Artis Delay  At [facility] for workup of your reynaud's  I will order some preliminary bloodwork and  The nerve conduction studies  Start the amlodipine at 1/2 tablet daily at bedtime

## 2017-07-28 NOTE — Assessment & Plan Note (Addendum)
She is requesting referral to rheumatologist at [facility] for work up. Preliminary labs to rule out other potentional diagnoses, including vasculitis and neuropathy, are in progress .recommend a  trial of amlodipine 2.5 mg

## 2017-08-01 ENCOUNTER — Other Ambulatory Visit (INDEPENDENT_AMBULATORY_CARE_PROVIDER_SITE_OTHER): Payer: BLUE CROSS/BLUE SHIELD

## 2017-08-01 DIAGNOSIS — I7300 Raynaud's syndrome without gangrene: Secondary | ICD-10-CM | POA: Diagnosis not present

## 2017-08-01 LAB — CBC WITH DIFFERENTIAL/PLATELET
BASOS PCT: 0.6 % (ref 0.0–3.0)
Basophils Absolute: 0 10*3/uL (ref 0.0–0.1)
EOS ABS: 0.1 10*3/uL (ref 0.0–0.7)
EOS PCT: 1.7 % (ref 0.0–5.0)
HEMATOCRIT: 40.5 % (ref 36.0–46.0)
HEMOGLOBIN: 13.5 g/dL (ref 12.0–15.0)
LYMPHS PCT: 25.4 % (ref 12.0–46.0)
Lymphs Abs: 1.9 10*3/uL (ref 0.7–4.0)
MCHC: 33.4 g/dL (ref 30.0–36.0)
MCV: 105 fl — ABNORMAL HIGH (ref 78.0–100.0)
Monocytes Absolute: 0.5 10*3/uL (ref 0.1–1.0)
Monocytes Relative: 7 % (ref 3.0–12.0)
Neutro Abs: 5 10*3/uL (ref 1.4–7.7)
Neutrophils Relative %: 65.3 % (ref 43.0–77.0)
Platelets: 237 10*3/uL (ref 150.0–400.0)
RBC: 3.85 Mil/uL — AB (ref 3.87–5.11)
RDW: 13.7 % (ref 11.5–15.5)
WBC: 7.6 10*3/uL (ref 4.0–10.5)

## 2017-08-01 LAB — SEDIMENTATION RATE: SED RATE: 2 mm/h (ref 0–20)

## 2017-08-01 LAB — TSH: TSH: 0.59 u[IU]/mL (ref 0.35–4.50)

## 2017-08-01 NOTE — Addendum Note (Signed)
Addended by: Leeanne Rio on: 08/01/2017 12:05 PM   Modules accepted: Orders

## 2017-08-04 ENCOUNTER — Encounter: Payer: Self-pay | Admitting: Internal Medicine

## 2017-08-08 LAB — ANA+ENA+DNA/DS+SCL 70+SJOSSA/B
ANA Titer 1: POSITIVE — AB
ENA RNP Ab: 0.4 AI (ref 0.0–0.9)
ENA SSB (LA) Ab: <0.2 AI (ref 0.0–0.9)
dsDNA Ab: 1 IU/mL (ref 0–9)

## 2017-08-08 LAB — HLA-B27 ANTIGEN: HLA B27: NEGATIVE

## 2017-08-08 LAB — FANA STAINING PATTERNS: CENTROMERE AB: 1:1280 {titer} — ABNORMAL HIGH

## 2017-08-09 ENCOUNTER — Telehealth: Payer: Self-pay | Admitting: Internal Medicine

## 2017-08-09 DIAGNOSIS — R200 Anesthesia of skin: Secondary | ICD-10-CM

## 2017-08-09 DIAGNOSIS — R202 Paresthesia of skin: Principal | ICD-10-CM

## 2017-08-09 DIAGNOSIS — M5010 Cervical disc disorder with radiculopathy, unspecified cervical region: Secondary | ICD-10-CM

## 2017-08-09 NOTE — Telephone Encounter (Signed)
Pt needs a referral in order to sch pt for the NCV with EMG. Due to pt not seen at [facility] before per referral Please and thank you!

## 2017-08-10 NOTE — Telephone Encounter (Signed)
Please advise 

## 2017-08-10 NOTE — Telephone Encounter (Signed)
The emg/Kristi Spence was ordered on Nov 28th ,  Is there a problem with the way I ordered it?

## 2017-08-13 NOTE — Telephone Encounter (Addendum)
I finally understand  ;  You are implying  That  they nened a "Neurology referral." to do the EMG

## 2017-08-13 NOTE — Telephone Encounter (Signed)
No. Kristi Spence stated a referral needs to be sent in order to sch being that the pt has not been seen at [facility] before. Thank you!

## 2017-08-15 NOTE — Telephone Encounter (Signed)
Yes

## 2017-09-05 ENCOUNTER — Other Ambulatory Visit: Payer: Self-pay | Admitting: Obstetrics and Gynecology

## 2017-09-05 ENCOUNTER — Encounter: Payer: Self-pay | Admitting: Internal Medicine

## 2017-09-05 DIAGNOSIS — Z1231 Encounter for screening mammogram for malignant neoplasm of breast: Secondary | ICD-10-CM

## 2017-09-06 ENCOUNTER — Encounter: Payer: Self-pay | Admitting: Internal Medicine

## 2017-09-13 ENCOUNTER — Telehealth: Payer: Self-pay | Admitting: Internal Medicine

## 2017-09-13 DIAGNOSIS — I7300 Raynaud's syndrome without gangrene: Secondary | ICD-10-CM

## 2017-09-13 NOTE — Telephone Encounter (Signed)
Pt dropped off lab results for Dr. Derrel Nip. Results along with a note from patient is up front in [place]'s color folder.

## 2017-09-13 NOTE — Telephone Encounter (Signed)
Results have been placed in yellow folder.  

## 2017-09-14 ENCOUNTER — Other Ambulatory Visit: Payer: Self-pay | Admitting: Internal Medicine

## 2017-09-14 NOTE — Telephone Encounter (Signed)
Tell  Her thank you for the EGD and colonoscopy report.  I goodgled Scleroderma [facility] and found that  there are several [facility] rheumatologists who manage scleroderma.  Dr Haydee Monica has 2 locations [employer] but I honestly don't know anything about him , but I like the facto that he works in an orthopedics office since she is a runner . I will make the referral if she is in agreement.

## 2017-09-17 NOTE — Telephone Encounter (Signed)
Spoke with pt and she stated that she would love to go forward with the referral to Dr. Haydee Monica at [facility].

## 2017-09-18 NOTE — Telephone Encounter (Signed)
Pt.notified

## 2017-09-18 NOTE — Telephone Encounter (Signed)
  Your referral is in process,  as requested, to   [facility]     .  Our referral coordinator will call you when the appointment has been made.

## 2017-09-27 ENCOUNTER — Other Ambulatory Visit: Payer: Self-pay | Admitting: Internal Medicine

## 2017-09-27 ENCOUNTER — Encounter: Payer: Self-pay | Admitting: Internal Medicine

## 2017-09-27 DIAGNOSIS — F900 Attention-deficit hyperactivity disorder, predominantly inattentive type: Secondary | ICD-10-CM

## 2017-09-27 DIAGNOSIS — G5603 Carpal tunnel syndrome, bilateral upper limbs: Secondary | ICD-10-CM | POA: Insufficient documentation

## 2017-09-27 MED ORDER — METHYLPHENIDATE HCL 5 MG PO TABS: 5.0000 mg | ORAL_TABLET | Freq: Every day | ORAL | 0 refills | Status: DC

## 2017-09-27 NOTE — Telephone Encounter (Signed)
Patient informed can come by and pick up

## 2017-09-27 NOTE — Telephone Encounter (Signed)
Ok to refill 

## 2017-10-29 ENCOUNTER — Other Ambulatory Visit: Payer: Self-pay | Admitting: Neurology

## 2017-10-29 DIAGNOSIS — H539 Unspecified visual disturbance: Secondary | ICD-10-CM

## 2017-11-06 ENCOUNTER — Ambulatory Visit
Admission: RE | Admit: 2017-11-06 | Discharge: 2017-11-06 | Disposition: A | Discharge status: Home / Self Care | Payer: BLUE CROSS/BLUE SHIELD | Source: Ambulatory Visit | Attending: Neurology | Admitting: Neurology

## 2017-11-06 DIAGNOSIS — H539 Unspecified visual disturbance: Secondary | ICD-10-CM | POA: Diagnosis not present

## 2017-11-06 DIAGNOSIS — R2689 Other abnormalities of gait and mobility: Secondary | ICD-10-CM | POA: Diagnosis not present

## 2017-11-06 MED ORDER — GADOBENATE DIMEGLUMINE 529 MG/ML IV SOLN
10.0000 mL | Freq: Once | INTRAVENOUS | Status: AC | PRN
  Administered 2017-11-06: 10 mL via INTRAVENOUS

## 2017-11-26 ENCOUNTER — Encounter: Payer: Self-pay | Admitting: Emergency Medicine

## 2017-11-26 ENCOUNTER — Emergency Department: Payer: BLUE CROSS/BLUE SHIELD

## 2017-11-26 ENCOUNTER — Emergency Department
Admission: EM | Admit: 2017-11-26 | Discharge: 2017-11-26 | Disposition: A | Discharge status: Home / Self Care | Payer: BLUE CROSS/BLUE SHIELD | Attending: Emergency Medicine | Admitting: Emergency Medicine

## 2017-11-26 ENCOUNTER — Other Ambulatory Visit: Payer: Self-pay

## 2017-11-26 DIAGNOSIS — W109XXA Fall (on) (from) unspecified stairs and steps, initial encounter: Secondary | ICD-10-CM | POA: Insufficient documentation

## 2017-11-26 DIAGNOSIS — Z79899 Other long term (current) drug therapy: Secondary | ICD-10-CM | POA: Diagnosis not present

## 2017-11-26 DIAGNOSIS — Y929 Unspecified place or not applicable: Secondary | ICD-10-CM | POA: Insufficient documentation

## 2017-11-26 DIAGNOSIS — S3992XA Unspecified injury of lower back, initial encounter: Secondary | ICD-10-CM | POA: Diagnosis present

## 2017-11-26 DIAGNOSIS — Y999 Unspecified external cause status: Secondary | ICD-10-CM | POA: Insufficient documentation

## 2017-11-26 DIAGNOSIS — Y9301 Activity, walking, marching and hiking: Secondary | ICD-10-CM | POA: Diagnosis not present

## 2017-11-26 DIAGNOSIS — S322XXA Fracture of coccyx, initial encounter for closed fracture: Secondary | ICD-10-CM | POA: Insufficient documentation

## 2017-11-26 MED ORDER — OXYCODONE HCL 5 MG PO TABS: 5.0000 mg | ORAL_TABLET | Freq: Three times a day (TID) | ORAL | 0 refills | Status: DC | PRN

## 2017-11-26 MED ORDER — NAPROXEN 500 MG PO TABS: 500.0000 mg | ORAL_TABLET | Freq: Two times a day (BID) | ORAL | 0 refills | Status: DC

## 2017-11-26 NOTE — ED Triage Notes (Signed)
Slipped and fell down steps this morning.  C/O coccyx pain.

## 2017-11-26 NOTE — ED Provider Notes (Signed)
[facility] Emergency Department Provider Note ____________________________________________  Time seen: Approximately 9:56 AM  I have reviewed the triage vital signs and the nursing notes.   HISTORY  Chief Complaint Fall    HPI Kristi Spence is a 47 y.o. female who presents to the emergency department for evaluation and treatment of sacrum and coccyx pain after a mechanical, non-syncopal fall this morning.  She states that she was walking down the stairs and slipped, landing on her coccyx then bounced down to more stairs.  She has a history of a coccyx fracture.  Past Medical History:  Diagnosis Date  . ADD (attention deficit disorder)   . Cervical disc disorder with radiculopathy of cervical region   . Depression   . Heart palpitations   . Raynaud's disease 2013  . Raynaud's syndrome without gangrene     Patient Active Problem List   Diagnosis Date Noted  . Carpal tunnel syndrome, bilateral 09/27/2017  . Anxiety as acute reaction to exceptional stress 03/27/2017  . Spouse of alcoholic 78/29/5621  . Chronic right-sided low back pain 12/17/2016  . Degenerative joint disease (DJD) of hip 12/17/2016  . Allergy to sulfa drugs 09/14/2016  . Counseling for marital and partner problems 02/15/2016  . Menstrual irregularity 02/15/2016  . Cervical disc disorder with radiculopathy of cervical region 06/27/2015  . Dermatitis 06/27/2015  . Posterior neck pain 06/23/2015  . Fibroadenoma of left breast 05/19/2015  . Attention deficit hyperactivity disorder (ADHD), predominantly inattentive type 04/03/2014  . Loss of libido 04/03/2014  . IBS (irritable bowel syndrome) 04/03/2014  . Screening for lipoid disorders 07/27/2011  . Raynaud phenomenon 07/26/2011    Past Surgical History:  Procedure Laterality Date  . BREAST BIOPSY Left 11-11-14   FIBROADENOMA core  . CESAREAN SECTION  02/2003, 11/2005  . COLONOSCOPY WITH PROPOFOL N/A 12/25/2016   Procedure:  COLONOSCOPY WITH PROPOFOL;  Surgeon: Lollie Sails, MD;  Location: [facility] ENDOSCOPY;  Service: Endoscopy;  Laterality: N/A;  . ESOPHAGOGASTRODUODENOSCOPY (EGD) WITH PROPOFOL N/A 12/25/2016   Procedure: ESOPHAGOGASTRODUODENOSCOPY (EGD) WITH PROPOFOL;  Surgeon: Lollie Sails, MD;  Location: [facility] ENDOSCOPY;  Service: Endoscopy;  Laterality: N/A;    Prior to Admission medications   Medication Sig Start Date End Date Taking? Authorizing Provider  amLODipine (NORVASC) 2.5 MG tablet Take 1 tablet (2.5 mg total) by mouth daily. 07/25/17   Crecencio Mc, MD  etonogestrel (NEXPLANON) 68 MG IMPL implant Inject into the skin.    [provider]  meloxicam (MOBIC) 7.5 MG tablet Take 7.5 mg by mouth daily.    [provider]  methylphenidate (RITALIN) 5 MG tablet Take 1 tablet (5 mg total) by mouth daily. 09/27/17   Crecencio Mc, MD  naproxen (NAPROSYN) 500 MG tablet Take 1 tablet (500 mg total) by mouth 2 (two) times daily with a meal. 11/26/17   Mirl Hillery B, FNP  oxyCODONE (ROXICODONE) 5 MG immediate release tablet Take 1 tablet (5 mg total) by mouth every 8 (eight) hours as needed. 11/26/17 11/26/18  Julieana Eshleman, Dessa Phi, FNP  promethazine (PHENERGAN) 25 MG suppository Place 1 suppository (25 mg total) rectally every 6 (six) hours as needed for nausea or vomiting. Patient not taking: Reported on 12/25/2016 09/12/16   Crecencio Mc, MD  sertraline (ZOLOFT) 50 MG tablet TAKE 1 TABLET BY MOUTH EVERY DAY 09/14/17   Crecencio Mc, MD  valACYclovir (VALTREX) 500 MG tablet TAKE ONE TABLET BY MOUTH DAILY 12/14/15   Crecencio Mc, MD  Allergies Adhesive [tape]; Doxepin; Penicillins; Sulfa antibiotics; Sulfasalazine; and Other  Family History  Problem Relation Age of Onset  . Heart disease Father   . Hypertension Father   . Cancer Mother        squamous cell  . Cancer Maternal Grandfather        leukemia  . Stroke Paternal Grandmother   . Stroke Paternal Grandfather   . Breast  cancer Neg Hx     Social History Social History   Tobacco Use  . Smoking status: Never Smoker  . Smokeless tobacco: Never Used  Substance Use Topics  . Alcohol use: Yes    Alcohol/week: 3.0 oz    Types: 5 Standard drinks or equivalent per week  . Drug use: No    Review of Systems Constitutional: Negative for fever. Cardiovascular: Negative for chest pain. Respiratory: Negative for shortness of breath. Musculoskeletal: Positive for buttock pain Skin: Negative for open wound or lesion Neurological: Negative for decrease in sensation  ____________________________________________   PHYSICAL EXAM:  VITAL SIGNS: ED Triage Vitals  Enc Vitals Group     BP 11/26/17 0825 110/69     Pulse Rate 11/26/17 0825 94     Resp 11/26/17 0825 16     Temp 11/26/17 0825 98.6 F (37 C)     Temp Source 11/26/17 0825 Oral     SpO2 11/26/17 0825 100 %     Weight 11/26/17 0822 120 lb (54.4 kg)     Height 11/26/17 0822 5\' 4"  (1.626 m)     Head Circumference --      Peak Flow --      Pain Score 11/26/17 0822 7     Pain Loc --      Pain Edu? --      Excl. in [place]? --     Constitutional: Alert and oriented. Well appearing and in no acute distress. Eyes: Conjunctivae are clear without discharge or drainage Head: Atraumatic Neck: Supple Respiratory: No cough. Respirations are even and unlabored. Musculoskeletal: Tender in the distal sacrum and mid coccyx region Neurologic: Motor and sensory function is intact Skin: Intact Psychiatric: Affect and behavior are appropriate.  ____________________________________________   LABS (all labs ordered are listed, but only abnormal results are displayed)  Labs Reviewed - No data to display ____________________________________________  RADIOLOGY  Transverse fracture through the mid coccygeal region with mild ventral displacement of the distal moiety per radiology I, Sherrie George, personally viewed and evaluated these images (plain radiographs)  as part of my medical decision making, as well as reviewing the written report by the radiologist.  ___________________________________________   PROCEDURES  Procedures  ____________________________________________   INITIAL IMPRESSION / ASSESSMENT AND PLAN / ED COURSE  Kristi Spence is a 47 y.o. who presents to the emergency department for evaluation and treatment after sustaining a mechanical, non-syncopal fall where she landed directly on her coccyx.  Symptoms and exam are most consistent with coccyx fracture.  X-ray confirms the same.  She was advised to use a donut pillow for comfort.  She was also advised that she will need to take a stool softener, especially if she is taking the oxycodone.  Patient instructed to follow-up with orthopedics .  She was also instructed to return to the emergency department for symptoms that change or worsen if unable schedule an appointment with orthopedics or primary care.  Medications - No data to display  Pertinent labs & imaging results that were available during my care of the patient were  reviewed by me and considered in my medical decision making (see chart for details).  _________________________________________   FINAL CLINICAL IMPRESSION(S) / ED DIAGNOSES  Final diagnoses:  Closed fracture of coccyx, initial encounter [facility])    ED Discharge Orders        Ordered    naproxen (NAPROSYN) 500 MG tablet  2 times daily with meals     11/26/17 1042    oxyCODONE (ROXICODONE) 5 MG immediate release tablet  Every 8 hours PRN     11/26/17 1042       If controlled substance prescribed during this visit, 12 month history viewed on the [place] prior to issuing an initial prescription for Schedule II or III opiod.    Victorino Dike, FNP 11/26/17 1631    Lavonia Drafts, MD 11/27/17 [phone]

## 2017-11-26 NOTE — ED Notes (Signed)
Explained the wait    

## 2017-11-26 NOTE — ED Notes (Signed)
See triage note  States she slipped and  fell down steps  Landed on tailbone  States pain is mainly at tailbone took some advil PTA

## 2018-01-01 ENCOUNTER — Encounter: Payer: Self-pay | Admitting: Internal Medicine

## 2018-01-01 DIAGNOSIS — F900 Attention-deficit hyperactivity disorder, predominantly inattentive type: Secondary | ICD-10-CM

## 2018-01-02 ENCOUNTER — Telehealth: Payer: Self-pay | Admitting: Internal Medicine

## 2018-01-02 ENCOUNTER — Other Ambulatory Visit: Payer: Self-pay | Admitting: Internal Medicine

## 2018-01-02 DIAGNOSIS — F900 Attention-deficit hyperactivity disorder, predominantly inattentive type: Secondary | ICD-10-CM

## 2018-01-02 MED ORDER — METHYLPHENIDATE HCL 5 MG PO TABS: 5.0000 mg | ORAL_TABLET | Freq: Every day | ORAL | 0 refills | Status: DC

## 2018-01-02 MED ORDER — MELOXICAM 7.5 MG PO TABS: 7.5000 mg | ORAL_TABLET | Freq: Every day | ORAL | 0 refills | Status: DC

## 2018-01-02 NOTE — Telephone Encounter (Signed)
Meloxicam doesn't look like it has ever been filled by you.   Ritalin    Refilled: 09/27/2017   Last OV: 07/25/2017 Next OV: not scheduled

## 2018-01-03 NOTE — Telephone Encounter (Signed)
Placed up front for pt to come by and pick up

## 2018-02-23 IMAGING — MG MM DIGITAL SCREENING BILAT W/ CAD
6 series · 6 of 6 positions shown · non-contrast
Comparison: Previous exam(s).

CLINICAL DATA: Screening.

EXAM:
DIGITAL SCREENING BILATERAL MAMMOGRAM WITH CAD

[L CC]
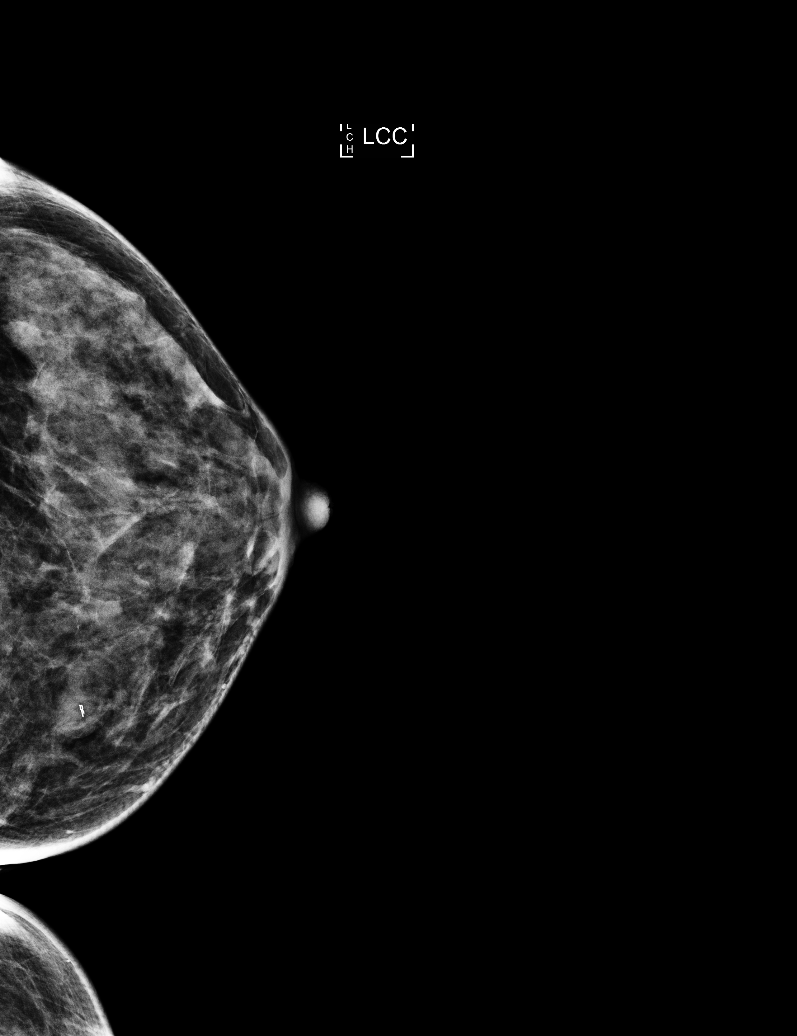

[L MLO (1 of 2)]
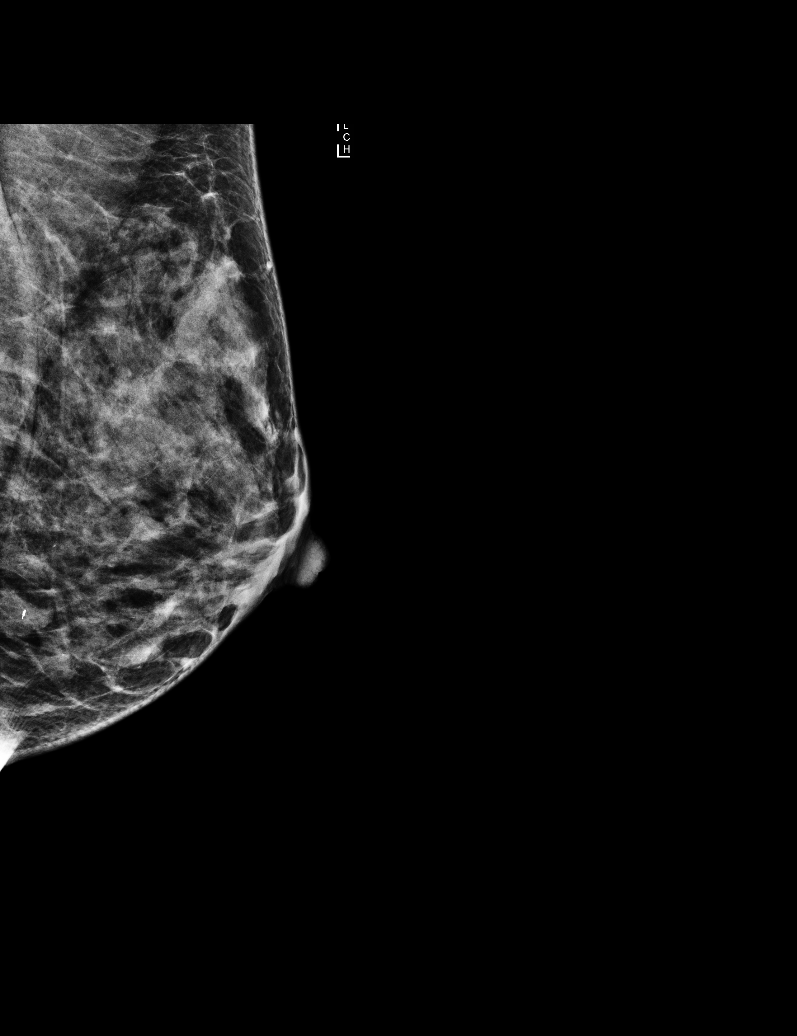

[L MLO (2 of 2)]
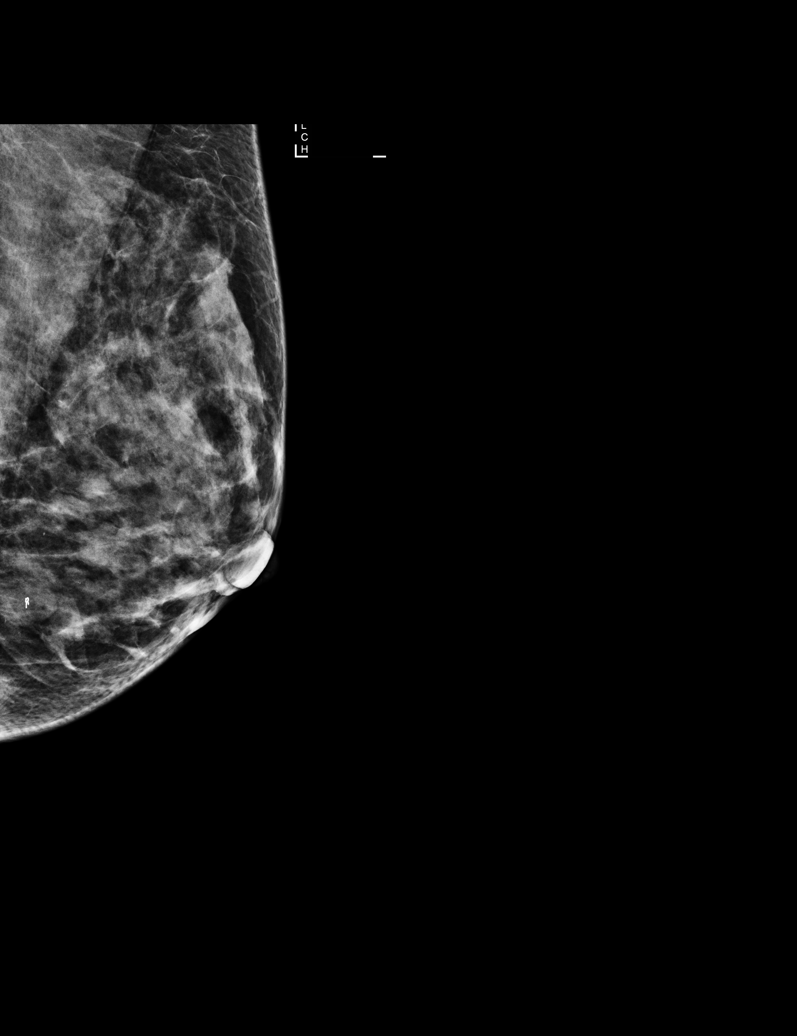

[R MLO (1 of 2)]
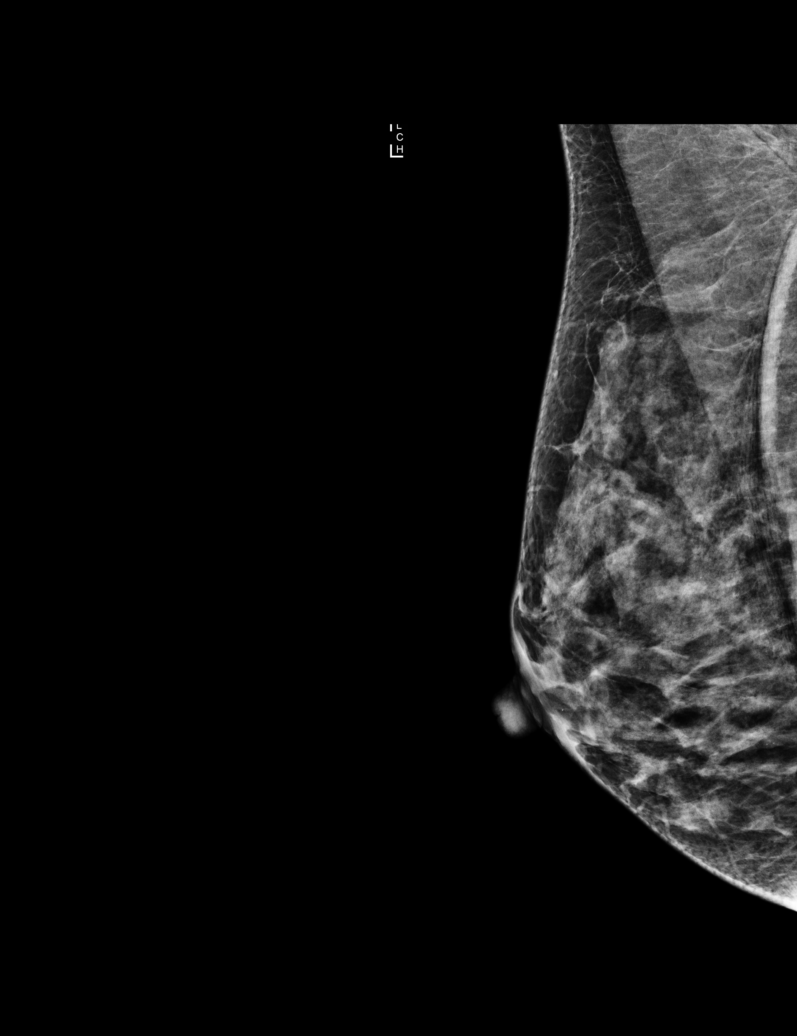

[R MLO (2 of 2)]
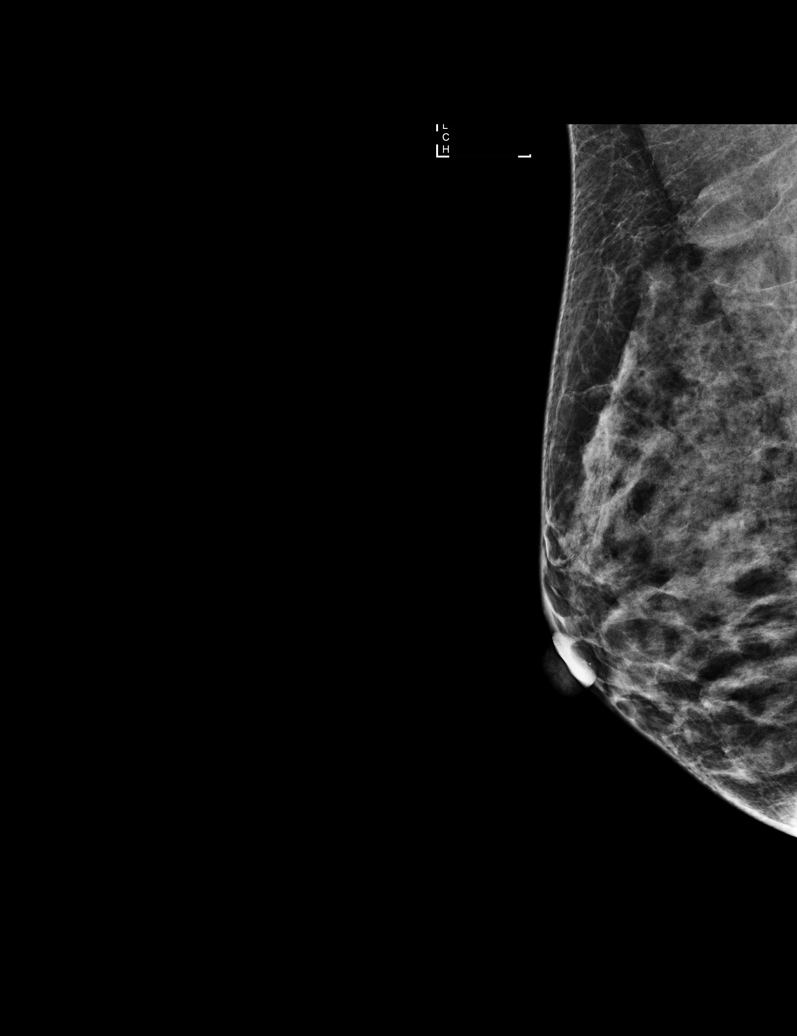

[R CC]
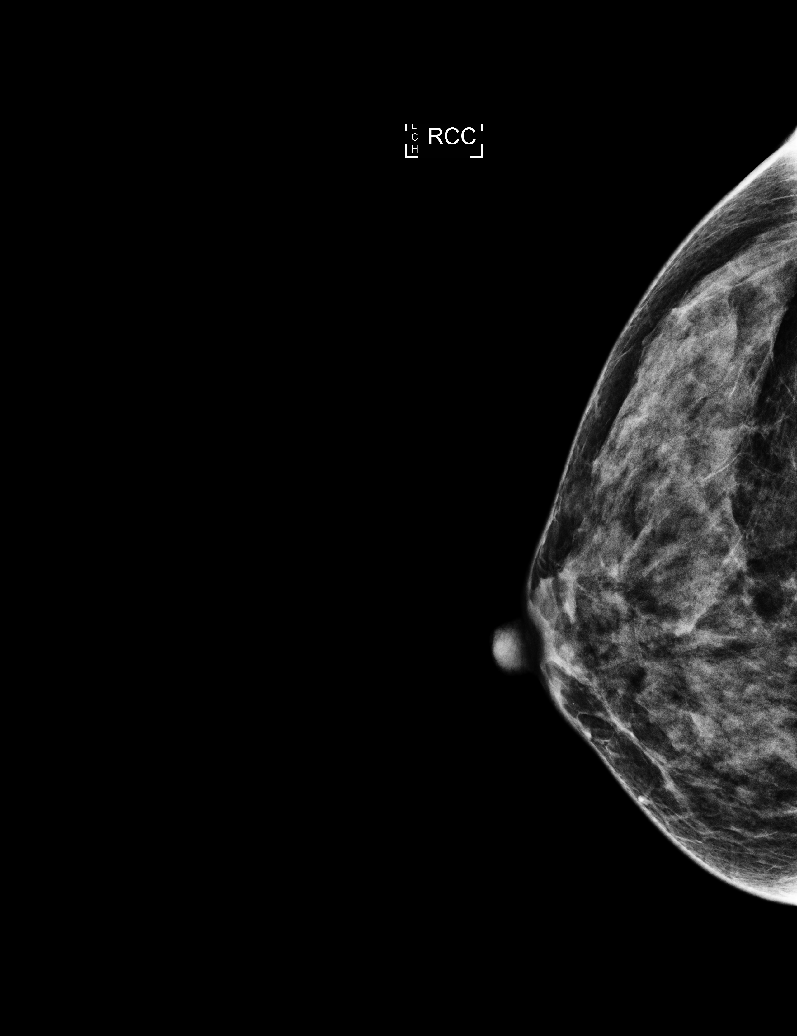

[6 of 6 positions shown; findings below may reference images not displayed]

ACR Breast Density Category d: The breast tissue is extremely dense,
which lowers the sensitivity of mammography.
FINDINGS: There are no findings suspicious for malignancy. Images were
processed with CAD.
IMPRESSION: No mammographic evidence of malignancy. A result letter of this
screening mammogram will be mailed directly to the patient.

RECOMMENDATION:
Screening mammogram in one year. (Code:[id number])

BI-RADS CATEGORY  1: Negative.

## 2018-03-04 ENCOUNTER — Other Ambulatory Visit: Payer: Self-pay | Admitting: Internal Medicine

## 2018-03-04 DIAGNOSIS — F900 Attention-deficit hyperactivity disorder, predominantly inattentive type: Secondary | ICD-10-CM

## 2018-03-04 NOTE — Telephone Encounter (Signed)
Refilled: 01/02/2018 Last OV: 07/25/2017 Next OV: not scheduled

## 2018-03-05 MED ORDER — METHYLPHENIDATE HCL 5 MG PO TABS: 5.0000 mg | ORAL_TABLET | Freq: Every day | ORAL | 0 refills | Status: DC

## 2018-03-05 NOTE — Telephone Encounter (Signed)
Yes, refilled for July August September , please print if not on printer in AM

## 2018-05-01 ENCOUNTER — Encounter: Payer: Self-pay | Admitting: Internal Medicine

## 2018-05-01 ENCOUNTER — Ambulatory Visit (INDEPENDENT_AMBULATORY_CARE_PROVIDER_SITE_OTHER): Payer: Self-pay | Admitting: Internal Medicine

## 2018-05-01 VITALS — BP 122/76 | HR 89 | Temp 98.3°F | Resp 14 | Ht 64.0 in | Wt 122.2 lb

## 2018-05-01 DIAGNOSIS — Z0184 Encounter for antibody response examination: Secondary | ICD-10-CM

## 2018-05-01 DIAGNOSIS — F900 Attention-deficit hyperactivity disorder, predominantly inattentive type: Secondary | ICD-10-CM

## 2018-05-01 DIAGNOSIS — Z0000 Encounter for general adult medical examination without abnormal findings: Secondary | ICD-10-CM

## 2018-05-01 DIAGNOSIS — Z23 Encounter for immunization: Secondary | ICD-10-CM

## 2018-05-01 DIAGNOSIS — Z111 Encounter for screening for respiratory tuberculosis: Secondary | ICD-10-CM

## 2018-05-01 MED ORDER — VALACYCLOVIR HCL 500 MG PO TABS: 500.0000 mg | ORAL_TABLET | Freq: Every day | ORAL | 5 refills | Status: DC

## 2018-05-01 NOTE — Progress Notes (Signed)
Patient ID: Kristi Spence, female    DOB: Oct 03, 1970  Age: 47 y.o. MRN: [id number]  The patient is here for annual preventive  examination and management of other chronic and acute problems.  Sees GYN for PAP smears and breast exam   The risk factors are reflected in the social history.  The roster of all physicians providing medical care to patient - is listed in the Snapshot section of the chart.  Activities of daily living:  The patient is 100% independent in all ADLs: dressing, toileting, feeding as well as independent mobility  Home safety : The patient has smoke detectors in the home. They wear seatbelts.  There are no firearms at home. There is no violence in the home.   There is no risks for hepatitis, STDs or HIV. There is no   history of blood transfusion. They have no travel history to infectious disease endemic areas of the world.  The patient has seen their dentist in the last six month. They have seen their eye doctor in the last year. They deny any hearing difficulty   They do not  have excessive sun exposure. Discussed the need for sun protection: hats, long sleeves and use of sunscreen if there is significant sun exposure.   Diet: the importance of a healthy diet is discussed. They do have a healthy diet.  The benefits of regular aerobic exercise were discussed. She runs 5 days per week ,  60 minutes.   Depression screen: there are no signs or vegative symptoms of depression- irritability, change in appetite, anhedonia, sadness/tearfullness.   The following portions of the patient's history were reviewed and updated as appropriate: allergies, current medications, past family history, past medical history,  past surgical history, past social history  and problem list.  Visual acuity was not assessed per patient preference since she has annual  follow up with her ophthalmologist. Hearing and body mass index were assessed and reviewed.   During the course of the  visit the patient was educated and counseled about appropriate screening and preventive services including : fall prevention , diabetes screening, nutrition counseling, colorectal cancer screening, and recommended immunizations.    CC: The primary encounter diagnosis was Need for measles-mumps-rubella (MMR) vaccine. Diagnoses of Need for influenza vaccination, Screening for tuberculosis, and Attention deficit hyperactivity disorder (ADHD), predominantly inattentive type were also pertinent to this visit.  History Kristi Spence has a past medical history of ADD (attention deficit disorder), Cervical disc disorder with radiculopathy of cervical region, Depression, Heart palpitations, Raynaud's disease (2013), and Raynaud's syndrome without gangrene.   She has a past surgical history that includes Cesarean section (02/2003, 11/2005); Colonoscopy with propofol (N/A, 12/25/2016); Esophagogastroduodenoscopy (egd) with propofol (N/A, 12/25/2016); and Breast biopsy (Left, 11-11-14).   Her family history includes Cancer in her maternal grandfather and mother; Heart disease in her father; Hypertension in her father; Stroke in her paternal grandfather and paternal grandmother.She reports that she has never smoked. She has never used smokeless tobacco. She reports that she drinks about 5.0 standard drinks of alcohol per week. She reports that she does not use drugs.  Outpatient Medications Prior to Visit  Medication Sig Dispense Refill  . etonogestrel (NEXPLANON) 68 MG IMPL implant Inject into the skin.    . meloxicam (MOBIC) 7.5 MG tablet Take 1 tablet (7.5 mg total) by mouth daily. 90 tablet 0  . methylphenidate (RITALIN) 5 MG tablet Take 1 tablet (5 mg total) by mouth daily. 30 tablet 0  . sertraline (ZOLOFT)  50 MG tablet TAKE 1 TABLET BY MOUTH EVERY DAY (Patient taking differently: Take 25 mg by mouth daily. ) 30 tablet 1  . valACYclovir (VALTREX) 500 MG tablet TAKE ONE TABLET BY MOUTH DAILY 30 tablet 5  .  amLODipine (NORVASC) 2.5 MG tablet Take 1 tablet (2.5 mg total) by mouth daily. (Patient not taking: Reported on 05/01/2018) 30 tablet 3  . naproxen (NAPROSYN) 500 MG tablet Take 1 tablet (500 mg total) by mouth 2 (two) times daily with a meal. (Patient not taking: Reported on 05/01/2018) 30 tablet 0  . oxyCODONE (ROXICODONE) 5 MG immediate release tablet Take 1 tablet (5 mg total) by mouth every 8 (eight) hours as needed. (Patient not taking: Reported on 05/01/2018) 20 tablet 0  . promethazine (PHENERGAN) 25 MG suppository Place 1 suppository (25 mg total) rectally every 6 (six) hours as needed for nausea or vomiting. (Patient not taking: Reported on 12/25/2016) 12 each 0   No facility-administered medications prior to visit.     Review of Systems   Patient denies headache, fevers, malaise, unintentional weight loss, skin rash, eye pain, sinus congestion and sinus pain, sore throat, dysphagia,  hemoptysis , cough, dyspnea, wheezing, chest pain, palpitations, orthopnea, edema, abdominal pain, nausea, melena, diarrhea, constipation, flank pain, dysuria, hematuria, urinary  Frequency, nocturia, numbness, tingling, seizures,  Focal weakness, Loss of consciousness,  Tremor, insomnia, depression, anxiety, and suicidal ideation.     Objective:  BP 122/76 (BP Location: Left Arm, Patient Position: Sitting, Cuff Size: Normal)   Pulse 89   Temp 98.3 F (36.8 C) (Oral)   Resp 14   Ht 5' 4" (1.626 m)   Wt 122 lb 3.2 oz (55.4 kg)   SpO2 98%   BMI 20.98 kg/m   Physical Exam   General appearance: alert, cooperative and appears stated age Ears: normal TM's and external ear canals both ears Throat: lips, mucosa, and tongue normal; teeth and gums normal Neck: no adenopathy, no carotid bruit, supple, symmetrical, trachea midline and thyroid not enlarged, symmetric, no tenderness/mass/nodules Back: symmetric, no curvature. ROM normal. No CVA tenderness. Lungs: clear to auscultation bilaterally Heart: regular  rate and rhythm, S1, S2 normal, no murmur, click, rub or gallop Abdomen: soft, non-tender; bowel sounds normal; no masses,  no organomegaly Pulses: 2+ and symmetric Skin: Skin color, texture, turgor normal. No rashes or lesions Lymph nodes: Cervical, supraclavicular, and axillary nodes normal. Psych: affect normal, makes good eye contact. No fidgeting,  Smiles easily.  Denies suicidal thoughts  Neuro:  awake and interactive with normal mood and affect. Higher cortical functions are normal. Speech is clear without word-finding difficulty or dysarthria. Extraocular movements are intact. Visual fields of both eyes are grossly intact. Sensation to light touch is grossly intact bilaterally of upper and lower extremities. Motor examination shows 4+/5 symmetric hand grip and upper extremity and 5/5 lower extremity strength. There is no pronation or drift. Gait is non-ataxic     Assessment & Plan:   Problem List Items Addressed This Visit    Attention deficit hyperactivity disorder (ADHD), predominantly inattentive type    Refills on ritalin given,  She is using 2.5 mg twice daily /5 days per week.        Other Visit Diagnoses    Need for measles-mumps-rubella (MMR) vaccine    -  Primary   Relevant Orders   Measles/Mumps/Rubella Immunity (Completed)   Need for influenza vaccination       Relevant Orders   Flu Vaccine QUAD 6+ mos PF   IM (Fluarix Quad PF) (Completed)   Screening for tuberculosis       Relevant Orders   TB Skin Test (Completed)      I have discontinued Cherlynn Perches. Pollinger's promethazine, amLODipine, naproxen, and oxyCODONE. I have also changed her valACYclovir. Additionally, I am having her maintain her etonogestrel, sertraline, meloxicam, methylphenidate, methylphenidate, and methylphenidate.  Meds ordered this encounter  Medications  . valACYclovir (VALTREX) 500 MG tablet    Sig: Take 1 tablet (500 mg total) by mouth daily.    Dispense:  30 tablet    Refill:  5  .  methylphenidate (RITALIN) 5 MG tablet    Sig: Take 1 tablet (5 mg total) by mouth daily.    Dispense:  30 tablet    Refill:  0  . methylphenidate (RITALIN) 5 MG tablet    Sig: Take 1 tablet (5 mg total) by mouth daily.    Dispense:  30 tablet    Refill:  0    Medications Discontinued During This Encounter  Medication Reason  . amLODipine (NORVASC) 2.5 MG tablet Patient has not taken in last 30 days  . naproxen (NAPROSYN) 500 MG tablet Patient has not taken in last 30 days  . oxyCODONE (ROXICODONE) 5 MG immediate release tablet Patient has not taken in last 30 days  . promethazine (PHENERGAN) 25 MG suppository Patient has not taken in last 30 days  . valACYclovir (VALTREX) 500 MG tablet Reorder    Follow-up: No follow-ups on file.   Crecencio Mc, MD

## 2018-05-01 NOTE — Patient Instructions (Addendum)
You are not due for  Your Tdap until 2023   We are checking your titers for Measle mumps and rubella to see if you need to be revaccinated.  PPD placed today ,  Return on Friday afternoon to have it "read"  (TB skin test)    You may be getting a viral  Syndrome .  The post nasal drip can cause your sore throat and cough .  Flush your sinuses once or  twice daily with  NeilMed's sinus rinse.  Use benadryl 25 mg at bedtime  for the post nasal drip and allegra/flonase during the day    Gargle with salt water as needed for the sore throat.  If you develop T > 100.4,  Body aches,  Green nasal discharge,  Ear Or facial pain,  Please let me know

## 2018-05-02 ENCOUNTER — Ambulatory Visit
Admission: RE | Admit: 2018-05-02 | Discharge: 2018-05-02 | Disposition: A | Discharge status: Home / Self Care | Payer: Self-pay | Source: Ambulatory Visit | Attending: Obstetrics and Gynecology | Admitting: Obstetrics and Gynecology

## 2018-05-02 DIAGNOSIS — Z1231 Encounter for screening mammogram for malignant neoplasm of breast: Secondary | ICD-10-CM | POA: Insufficient documentation

## 2018-05-02 LAB — MEASLES/MUMPS/RUBELLA IMMUNITY
Mumps IgG: 77.5 AU/mL
RUBELLA: 6.52 {index}
RUBEOLA IGG: 154 [AU]/ml

## 2018-05-03 ENCOUNTER — Ambulatory Visit: Payer: Self-pay | Admitting: *Deleted

## 2018-05-03 DIAGNOSIS — Z111 Encounter for screening for respiratory tuberculosis: Secondary | ICD-10-CM

## 2018-05-03 LAB — TB SKIN TEST
Induration: 0 mm
TB SKIN TEST: NEGATIVE

## 2018-05-03 NOTE — Progress Notes (Signed)
Form for emploment given to patient copy made for chart.

## 2018-05-04 MED ORDER — METHYLPHENIDATE HCL 5 MG PO TABS: 5.0000 mg | ORAL_TABLET | Freq: Every day | ORAL | 0 refills | Status: DC

## 2018-05-04 NOTE — Assessment & Plan Note (Signed)
Refills on ritalin given,  She is using 2.5 mg twice daily /5 days per week.

## 2018-05-06 ENCOUNTER — Other Ambulatory Visit: Payer: Self-pay | Admitting: Obstetrics and Gynecology

## 2018-05-06 DIAGNOSIS — R928 Other abnormal and inconclusive findings on diagnostic imaging of breast: Secondary | ICD-10-CM

## 2018-05-06 DIAGNOSIS — R921 Mammographic calcification found on diagnostic imaging of breast: Secondary | ICD-10-CM

## 2018-05-14 ENCOUNTER — Ambulatory Visit
Admission: RE | Admit: 2018-05-14 | Discharge: 2018-05-14 | Disposition: A | Discharge status: Home / Self Care | Payer: PRIVATE HEALTH INSURANCE | Source: Ambulatory Visit | Attending: Obstetrics and Gynecology | Admitting: Obstetrics and Gynecology

## 2018-05-14 DIAGNOSIS — R928 Other abnormal and inconclusive findings on diagnostic imaging of breast: Secondary | ICD-10-CM

## 2018-05-14 DIAGNOSIS — R921 Mammographic calcification found on diagnostic imaging of breast: Secondary | ICD-10-CM

## 2018-05-20 ENCOUNTER — Other Ambulatory Visit: Payer: Self-pay

## 2018-05-20 DIAGNOSIS — F900 Attention-deficit hyperactivity disorder, predominantly inattentive type: Secondary | ICD-10-CM

## 2018-05-20 NOTE — Telephone Encounter (Signed)
rx request Last filled on 05-05-18 ( from refill on 03-05-18) Last seen on 05-01-18

## 2018-05-21 MED ORDER — METHYLPHENIDATE HCL 5 MG PO TABS: 5.0000 mg | ORAL_TABLET | Freq: Every day | ORAL | 0 refills | Status: DC

## 2018-06-27 ENCOUNTER — Telehealth: Payer: Self-pay | Admitting: Internal Medicine

## 2018-07-02 ENCOUNTER — Telehealth: Payer: Self-pay | Admitting: Internal Medicine

## 2018-07-02 NOTE — Telephone Encounter (Signed)
Spoke with pt to let her know that the letter has ben completed. Pt stated that she would like to come by and pick up the letter so letter has been placed up front for pick up.

## 2018-07-02 NOTE — Telephone Encounter (Signed)
Letter to [facility] requested   The charge is $29

## 2018-10-02 IMAGING — CR DG SACRUM/COCCYX 2+V
1 series · 3 of 3 positions shown · non-contrast
Comparison: None.

CLINICAL DATA: Status post fall, landed on tailbone.

EXAM:
SACRUM AND COCCYX - 2+ VIEW

[Series 1: dg sacrum/coccyx · 0.14mm/px · 3 of 3 slices shown]
[im 1/3]
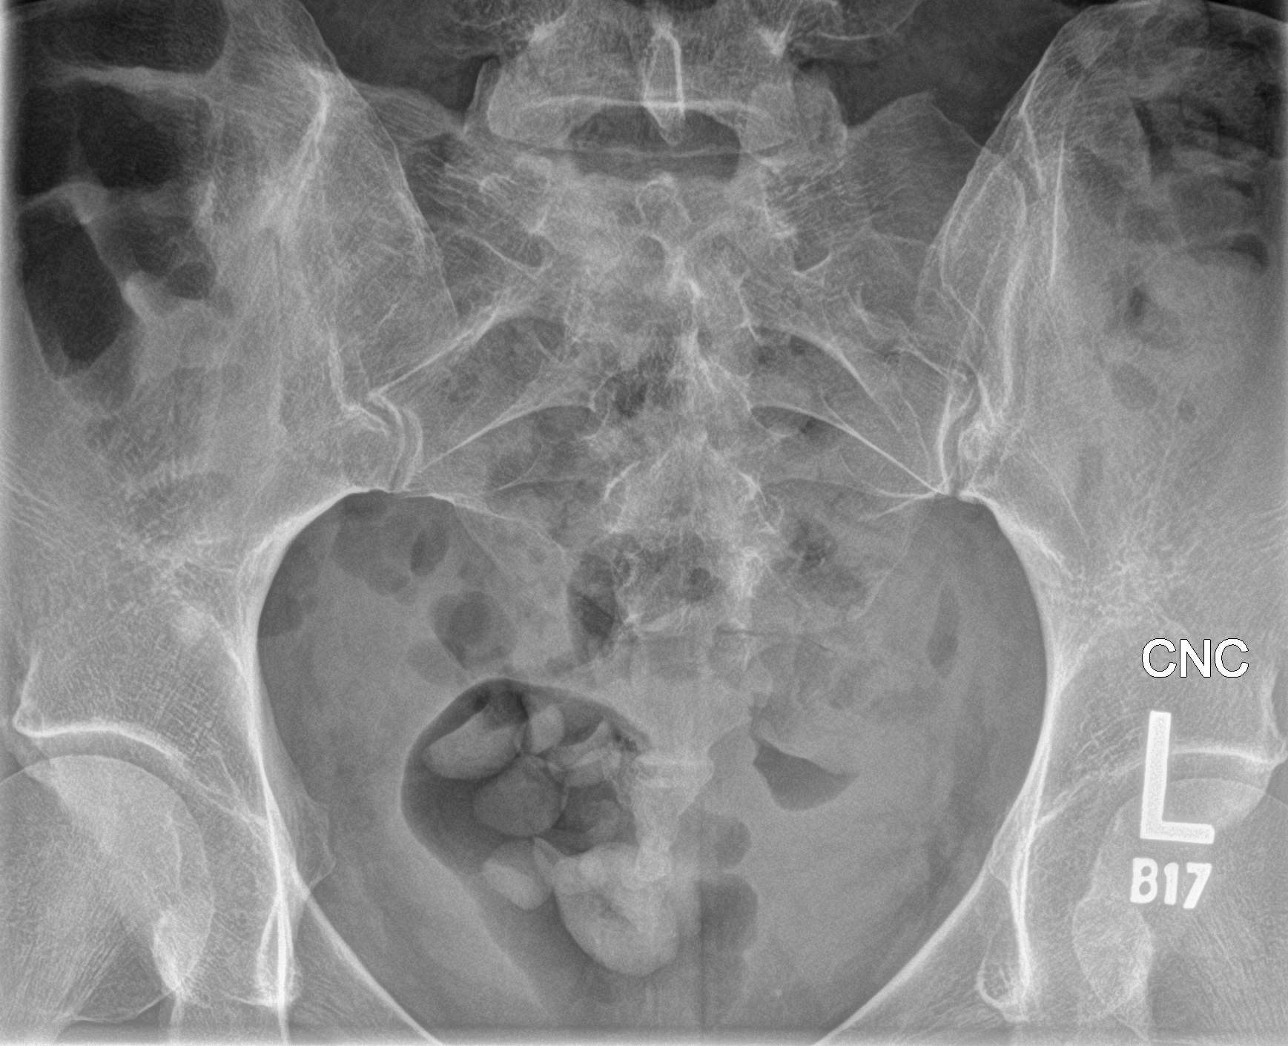
[im 2/3]
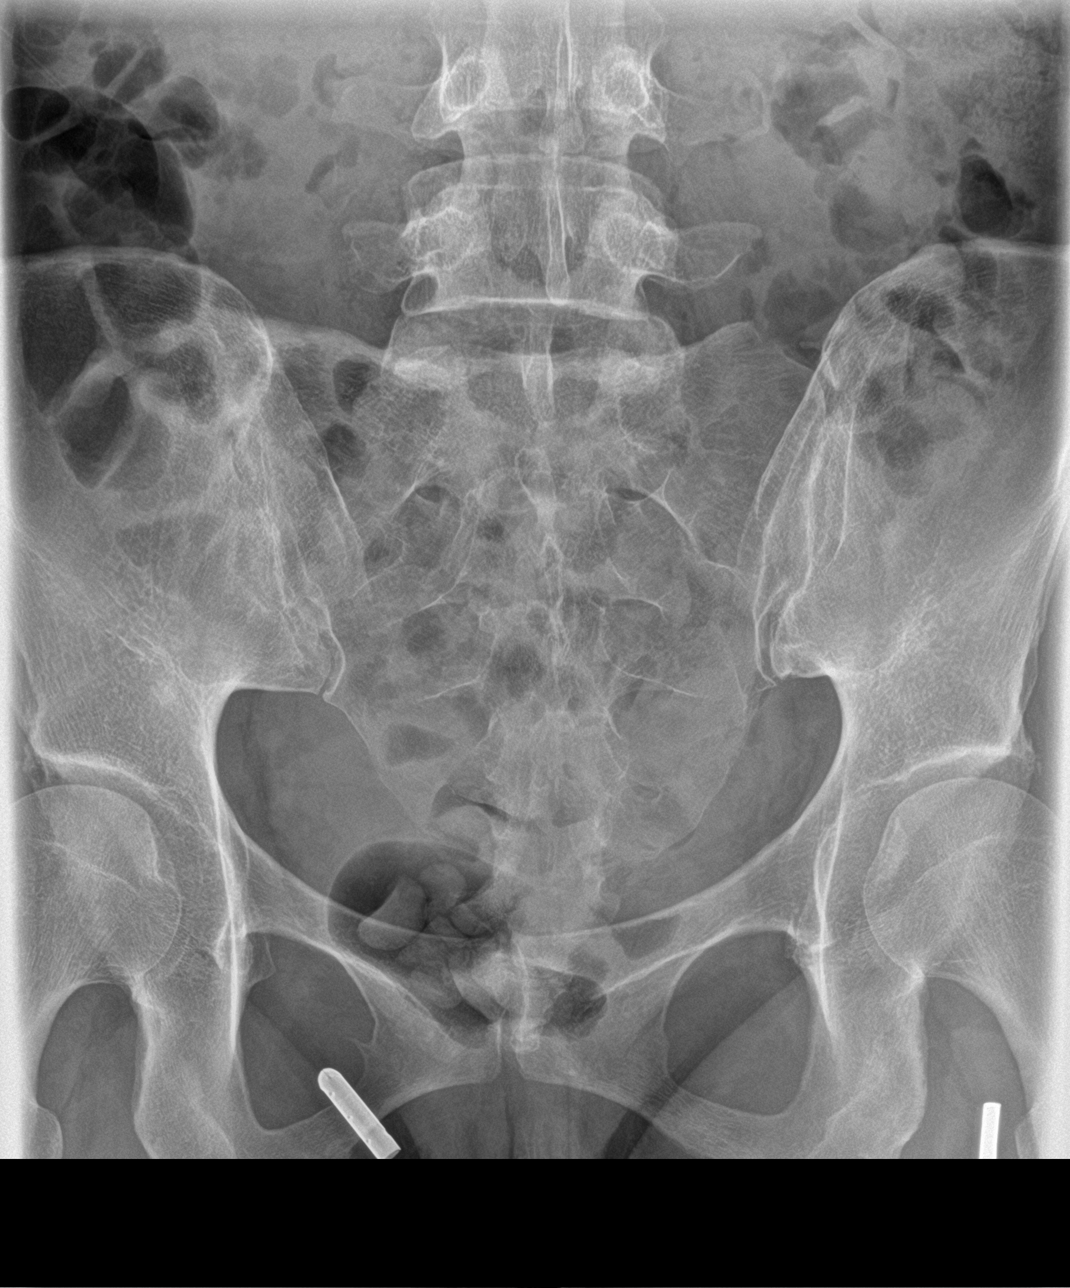
[im 3/3]
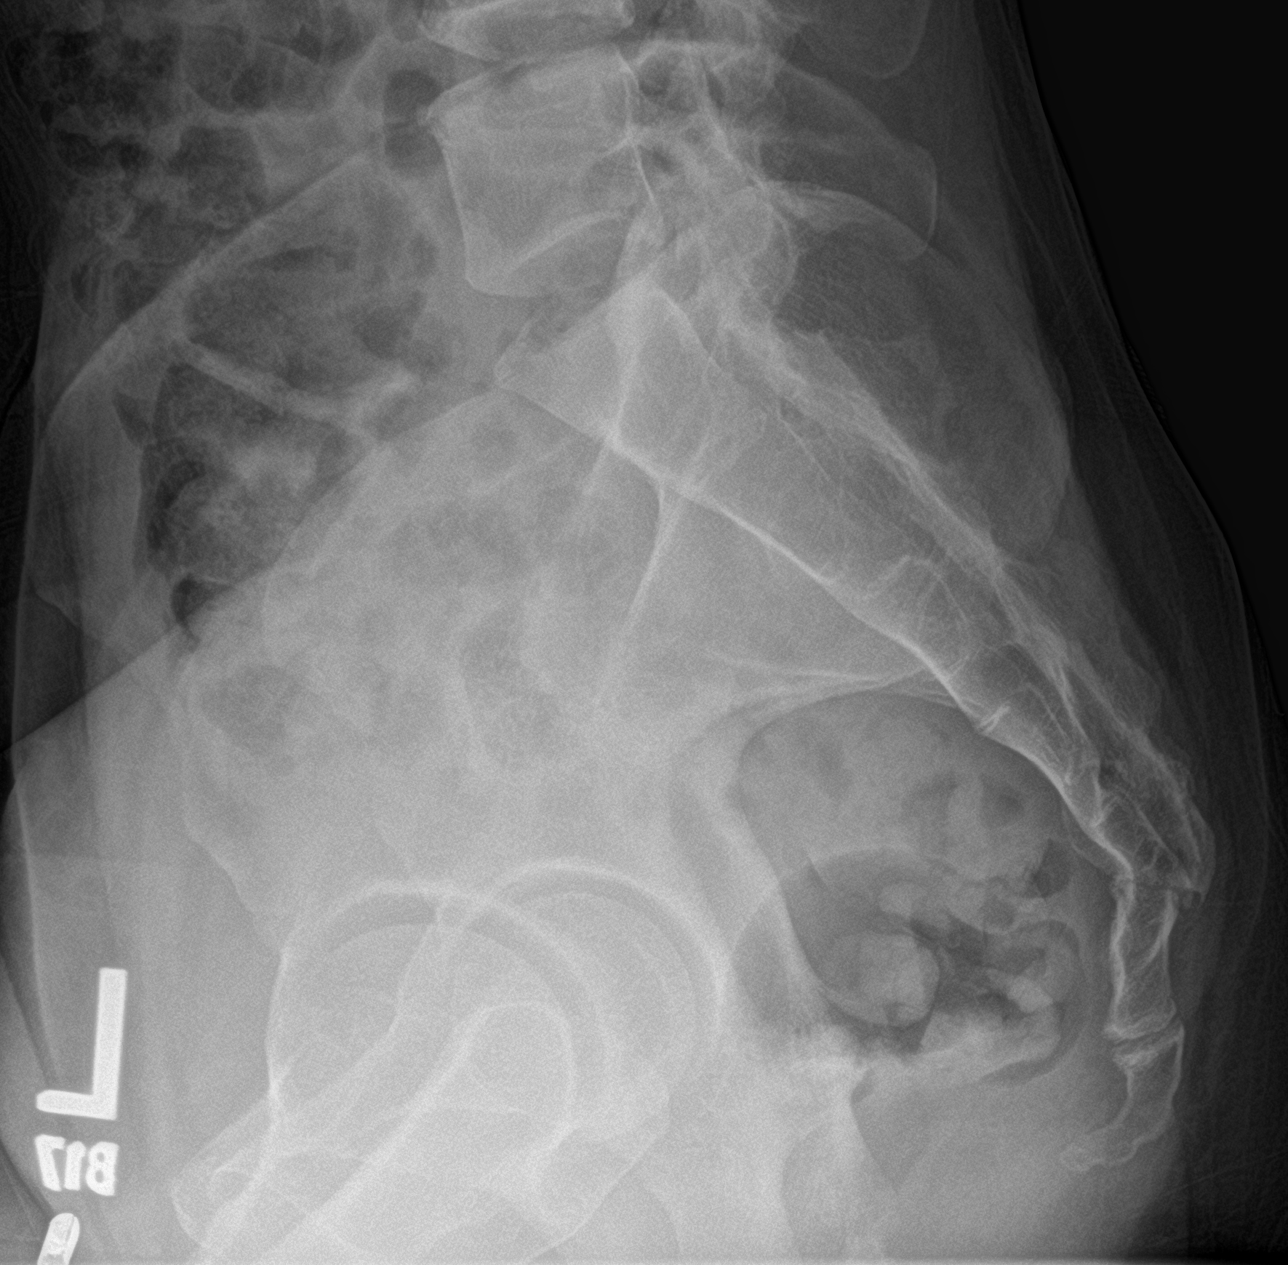

[3 of 3 positions shown; findings below may reference images not displayed]

FINDINGS: There is a transverse fracture through the mid coccygeal region.
This is seen best on the lateral radiograph (arrow). There may be
mild displacement of the distal coccyx on the proximal coccygeal
segment..
IMPRESSION: Transverse fracture through the mid coccygeal region, with mild
ventral displacement of the distal moiety.

## 2018-10-03 MED ORDER — SERTRALINE HCL 50 MG PO TABS: 50.0000 mg | ORAL_TABLET | Freq: Every day | ORAL | 0 refills | Status: DC

## 2018-10-07 MED ORDER — SERTRALINE HCL 50 MG PO TABS: 50.0000 mg | ORAL_TABLET | Freq: Every day | ORAL | 0 refills | Status: DC

## 2018-10-07 NOTE — Addendum Note (Signed)
Addended by: Adair Laundry on: 10/07/2018 04:17 PM   Modules accepted: Orders

## 2018-11-23 ENCOUNTER — Other Ambulatory Visit: Payer: Self-pay

## 2018-11-23 DIAGNOSIS — F900 Attention-deficit hyperactivity disorder, predominantly inattentive type: Secondary | ICD-10-CM

## 2018-11-25 MED ORDER — MELOXICAM 7.5 MG PO TABS: 7.5000 mg | ORAL_TABLET | Freq: Every day | ORAL | 0 refills | Status: DC

## 2018-11-25 MED ORDER — METHYLPHENIDATE HCL 5 MG PO TABS: 5.0000 mg | ORAL_TABLET | Freq: Every day | ORAL | 0 refills | Status: DC

## 2018-11-25 MED ORDER — SERTRALINE HCL 50 MG PO TABS: 50.0000 mg | ORAL_TABLET | Freq: Every day | ORAL | 0 refills | Status: DC

## 2018-12-23 ENCOUNTER — Encounter: Payer: Self-pay | Admitting: Family Medicine

## 2018-12-23 ENCOUNTER — Other Ambulatory Visit: Payer: Self-pay

## 2018-12-23 ENCOUNTER — Ambulatory Visit (INDEPENDENT_AMBULATORY_CARE_PROVIDER_SITE_OTHER): Payer: Self-pay | Admitting: Family Medicine

## 2018-12-23 DIAGNOSIS — R040 Epistaxis: Secondary | ICD-10-CM

## 2018-12-23 DIAGNOSIS — L539 Erythematous condition, unspecified: Secondary | ICD-10-CM

## 2018-12-23 DIAGNOSIS — H9393 Unspecified disorder of ear, bilateral: Secondary | ICD-10-CM

## 2018-12-23 NOTE — Progress Notes (Signed)
Patient ID: Summar Mcglothlin, female   DOB: July 20, 1971, 48 y.o.   MRN: [id number]  Virtual Visit via video/phone Note  This visit type was conducted due to national recommendations for restrictions regarding the COVID-19 pandemic (e.g. social distancing).  This format is felt to be most appropriate for this patient at this time.  All issues noted in this document were discussed and addressed.  No physical exam was performed (except for noted visual exam findings with Video Visits).   I connected with Gasper Sells on 12/23/18 at  1:20 PM EDT by a video enabled telemedicine application or telephone and verified that I am speaking with the correct person using two identifiers. Location patient: home Location provider: [place] Persons participating in the virtual visit: patient, provider  I discussed the limitations, risks, security and privacy concerns of performing an evaluation and management service by video and telephone and the availability of in person appointments. I also discussed with the patient that there may be a patient responsible charge related to this service. The patient expressed understanding and agreed to proceed.  Interactive audio and video telecommunications were attempted between this provider and patient, however failed, due to patient & I  having technical difficulty; video connected at first and then froze.  We continued and completed visit with audio only via phone.  HPI:  Patient and I connected via video at first, but then video feed froze so we switched over to telephone to complete the visit.  Patient complains of having a nosebleed x2 over the course of last week.  She has not had nosebleed in maybe 20 years.  States she usually blows her nose every night prior to bed after washing her face, and 2 times last week had a short-lived nosebleed, lasted for maybe a few minutes and stopped up quickly on its own.  Patient currently does not use any sort of  nasal spray or allergy medication.  Does not take blood thinners regularly.  Also reports both of her ears feel hot, red and as if they are burning.  Patient did use a [employer]chand[employer] retail hair treatment yesterday, states has had this performed before at a salon but is never done at home on her own.  Wonders if this could potentially be causing her ear burning type sensation.  States she was diligent to wash out the chemical, and wash really well around her hairline and around the skin on her ears.  No other parts of her face, scalp are burning or seem irritated.  Denies fever or chills.  Denies cough, shortness with or wheezing.  Denies chest pain.  Denies body aches.  Denies GI or GU issues.  ROS: See pertinent positives and negatives per HPI.  Past Medical History:  Diagnosis Date  . ADD (attention deficit disorder)   . Cervical disc disorder with radiculopathy of cervical region   . Depression   . Heart palpitations   . Raynaud's disease 2013  . Raynaud's syndrome without gangrene     Past Surgical History:  Procedure Laterality Date  . BREAST BIOPSY Left 11-11-14   FIBROADENOMA core  . CESAREAN SECTION  02/2003, 11/2005  . COLONOSCOPY WITH PROPOFOL N/A 12/25/2016   Procedure: COLONOSCOPY WITH PROPOFOL;  Surgeon: Lollie Sails, MD;  Location[facility] ENDOSCOPY;  Service: Endoscopy;  Laterality: N/A;  . ESOPHAGOGASTRODUODENOSCOPY (EGD) WITH PROPOFOL N/A 12/25/2016   Procedure: ESOPHAGOGASTRODUODENOSCOPY (EGD) WITH PROPOFOL;  Surgeon: Lollie Sails, MD;  Location: [facility] ENDOSCOPY;  Service: Endoscopy;  Laterality: N/A;  Family History  Problem Relation Age of Onset  . Heart disease Father   . Hypertension Father   . Cancer Mother        squamous cell  . Cancer Maternal Grandfather        leukemia  . Stroke Paternal Grandmother   . Stroke Paternal Grandfather   . Breast cancer Neg Hx    Social History   Tobacco Use  . Smoking status: Never Smoker  . Smokeless tobacco:  Never Used  Substance Use Topics  . Alcohol use: Yes    Alcohol/week: 5.0 standard drinks    Types: 5 Standard drinks or equivalent per week    Current Outpatient Medications:  .  etonogestrel (NEXPLANON) 68 MG IMPL implant, Inject into the skin., Disp: , Rfl:  .  meloxicam (MOBIC) 7.5 MG tablet, Take 1 tablet (7.5 mg total) by mouth daily., Disp: 90 tablet, Rfl: 0 .  methylphenidate (RITALIN) 5 MG tablet, Take 1 tablet (5 mg total) by mouth daily., Disp: 30 tablet, Rfl: 0 .  methylphenidate (RITALIN) 5 MG tablet, Take 1 tablet (5 mg total) by mouth daily., Disp: 30 tablet, Rfl: 0 .  methylphenidate (RITALIN) 5 MG tablet, Take 1 tablet (5 mg total) by mouth daily., Disp: 30 tablet, Rfl: 0 .  sertraline (ZOLOFT) 50 MG tablet, Take 1 tablet (50 mg total) by mouth daily., Disp: 90 tablet, Rfl: 0 .  valACYclovir (VALTREX) 500 MG tablet, Take 1 tablet (500 mg total) by mouth daily., Disp: 30 tablet, Rfl: 5  EXAM:  GENERAL: alert, oriented, sounds well and in no acute distress  LUNGS: Speaking in full sentences, no signs of respiratory distress, breathing rate appears normal,  No gasping or wheezing  PSYCH/NEURO: pleasant and cooperative, no obvious depression or anxiety, speech and thought processing grossly intact  ASSESSMENT AND PLAN:  Discussed the following assessment and plan:  Problem of both ears  Erythema of skin - Both ears. ?allergic reaction to chemical hair straightener.  Bleeding from the nose  Redness of skin of ears, ears burning could potentially be an allergic reaction to chemical straightening hair product she used.  Patient wants to hold off on any oral steroids at this time and will try taking Benadryl this afternoon and prior to bed to see if this calm symptoms down.  Patient will let us know tomorrow if the ear burning and redness is still present and at that time she most likely said she will be willing to take steroid taper.  Currently nose is not bleeding.   Patient does have Flonase nasal spray at home.  Suggest that she try using Flonase nasal spray and also saline nasal spray to keep nasal mucosa moistened.  Advised that if she does have a nosebleed occurs again, Afrin nasal spray works well to stop a nosebleed quickly and is good to keep around in a first aid kit to use if needed for nose bleeding situations.  Advised that if she continues to have nosebleeds occur, and persistent nosebleeds we can have her see ear nose and throat for evaluation to see if a cauterization is required.   I discussed the assessment and treatment plan with the patient. The patient was provided an opportunity to ask questions and all were answered. The patient agreed with the plan and demonstrated an understanding of the instructions.   The patient was advised to call back or seek an in-person evaluation if the symptoms worsen or if the condition fails to improve as anticipated.  15 minutes spent with patient on video/phone discussing symptoms and plan of care.   Jodelle Green, FNP

## 2018-12-24 ENCOUNTER — Encounter: Payer: Self-pay | Admitting: Family Medicine

## 2018-12-24 DIAGNOSIS — L539 Erythematous condition, unspecified: Secondary | ICD-10-CM

## 2018-12-24 DIAGNOSIS — H9393 Unspecified disorder of ear, bilateral: Secondary | ICD-10-CM

## 2018-12-24 MED ORDER — PREDNISONE 5 MG (21) PO TBPK: ORAL_TABLET | ORAL | 0 refills | Status: DC

## 2018-12-29 ENCOUNTER — Other Ambulatory Visit: Payer: Self-pay | Admitting: Internal Medicine

## 2018-12-29 DIAGNOSIS — T6591XA Toxic effect of unspecified substance, accidental (unintentional), initial encounter: Secondary | ICD-10-CM | POA: Insufficient documentation

## 2018-12-29 MED ORDER — PREDNISONE 10 MG PO TABS: ORAL_TABLET | ORAL | 0 refills | Status: DC

## 2018-12-29 NOTE — Assessment & Plan Note (Signed)
Patient has developed a diffusely erythematous papular rash on her neck and face after using a chemical at-home hair straightener.  She was given a 6 day steroid taper by NP on Apri 28 and the rash has spread as the steroid dose has decreased.  I a prescribing a longer steroid taper with prednisone 60 mg daily x 3 followed by a 10mg  taper every 2 days until gone (13 days,  48 tablets) and advised her to use benadryl cream and claritin orally every 6 hoursas needed for the intense itching.

## 2019-02-17 ENCOUNTER — Other Ambulatory Visit: Payer: Self-pay | Admitting: Internal Medicine

## 2019-02-20 ENCOUNTER — Other Ambulatory Visit: Payer: Self-pay | Admitting: Internal Medicine

## 2019-06-20 NOTE — Telephone Encounter (Signed)
Error

## 2019-07-30 ENCOUNTER — Other Ambulatory Visit: Payer: Self-pay

## 2019-07-30 DIAGNOSIS — Z20822 Contact with and (suspected) exposure to covid-19: Secondary | ICD-10-CM

## 2019-08-01 LAB — NOVEL CORONAVIRUS, NAA: SARS-CoV-2, NAA: NOT DETECTED

## 2019-09-12 ENCOUNTER — Other Ambulatory Visit: Payer: Self-pay | Admitting: Internal Medicine

## 2019-09-12 MED ORDER — SERTRALINE HCL 50 MG PO TABS: 50.0000 mg | ORAL_TABLET | Freq: Every day | ORAL | 0 refills | Status: DC

## 2019-09-23 ENCOUNTER — Other Ambulatory Visit: Payer: Self-pay | Admitting: Internal Medicine

## 2019-09-23 DIAGNOSIS — N390 Urinary tract infection, site not specified: Secondary | ICD-10-CM | POA: Insufficient documentation

## 2019-09-23 MED ORDER — PHENAZOPYRIDINE HCL 200 MG PO TABS: 200.0000 mg | ORAL_TABLET | Freq: Three times a day (TID) | ORAL | 0 refills | Status: DC | PRN

## 2019-09-23 MED ORDER — CIPROFLOXACIN HCL 250 MG PO TABS: 250.0000 mg | ORAL_TABLET | Freq: Two times a day (BID) | ORAL | 0 refills | Status: DC

## 2019-11-20 ENCOUNTER — Ambulatory Visit: Payer: Self-pay | Attending: Internal Medicine

## 2019-11-20 DIAGNOSIS — Z23 Encounter for immunization: Secondary | ICD-10-CM

## 2019-11-20 NOTE — Progress Notes (Signed)
   Covid-19 Vaccination Clinic  Name:  Kristi Spence    MRN: [id number] DOB: March 10, 1971  11/20/2019  [name] was observed post Covid-19 immunization for 15 minutes without incident. She was provided with Vaccine Information Sheet and instruction to access the V-Safe system.   [name] was instructed to call 911 with any severe reactions post vaccine: Marland Kitchen Difficulty breathing  . Swelling of face and throat  . A fast heartbeat  . A bad rash all over body  . Dizziness and weakness   Immunizations Administered    Name Date Dose VIS Date Route   Pfizer COVID-19 Vaccine 11/20/2019  1:43 PM 0.3 mL 08/08/2019 Intramuscular   Manufacturer: [place]   Lot: [id number]   [place]: [id number]

## 2019-12-16 ENCOUNTER — Ambulatory Visit: Payer: Self-pay | Attending: Internal Medicine

## 2019-12-16 DIAGNOSIS — Z23 Encounter for immunization: Secondary | ICD-10-CM

## 2019-12-16 NOTE — Progress Notes (Signed)
   Covid-19 Vaccination Clinic  Name:  Kristi Spence    MRN: [id number] DOB: [birth year]  12/16/2019  [name] was observed post Covid-19 immunization for 15 minutes without incident. She was provided with Vaccine Information Sheet and instruction to access the V-Safe system.   [name] was instructed to call 911 with any severe reactions post vaccine: Marland Kitchen Difficulty breathing  . Swelling of face and throat  . A fast heartbeat  . A bad rash all over body  . Dizziness and weakness   Immunizations Administered    Name Date Dose VIS Date Route   Pfizer COVID-19 Vaccine 12/16/2019 12:38 PM 0.3 mL 10/22/2018 Intramuscular   Manufacturer: [place]   Lot: [id number]   [place]: [id number]

## 2019-12-23 ENCOUNTER — Other Ambulatory Visit: Payer: Self-pay

## 2019-12-23 MED ORDER — SERTRALINE HCL 50 MG PO TABS: 50.0000 mg | ORAL_TABLET | Freq: Every day | ORAL | 0 refills | Status: DC

## 2019-12-31 ENCOUNTER — Telehealth (INDEPENDENT_AMBULATORY_CARE_PROVIDER_SITE_OTHER): Payer: PRIVATE HEALTH INSURANCE | Admitting: Internal Medicine

## 2019-12-31 ENCOUNTER — Encounter: Payer: Self-pay | Admitting: Internal Medicine

## 2019-12-31 VITALS — BP 110/60 | Ht 64.0 in | Wt 122.5 lb

## 2019-12-31 DIAGNOSIS — Z975 Presence of (intrauterine) contraceptive device: Secondary | ICD-10-CM

## 2019-12-31 DIAGNOSIS — Z30011 Encounter for initial prescription of contraceptive pills: Secondary | ICD-10-CM

## 2019-12-31 DIAGNOSIS — Z1231 Encounter for screening mammogram for malignant neoplasm of breast: Secondary | ICD-10-CM

## 2019-12-31 DIAGNOSIS — R200 Anesthesia of skin: Secondary | ICD-10-CM

## 2019-12-31 DIAGNOSIS — F900 Attention-deficit hyperactivity disorder, predominantly inattentive type: Secondary | ICD-10-CM

## 2019-12-31 DIAGNOSIS — R202 Paresthesia of skin: Secondary | ICD-10-CM

## 2019-12-31 DIAGNOSIS — E782 Mixed hyperlipidemia: Secondary | ICD-10-CM

## 2019-12-31 DIAGNOSIS — G5603 Carpal tunnel syndrome, bilateral upper limbs: Secondary | ICD-10-CM

## 2019-12-31 MED ORDER — AMPHETAMINE-DEXTROAMPHETAMINE 5 MG PO TABS: 5.0000 mg | ORAL_TABLET | Freq: Every day | ORAL | 0 refills | Status: DC

## 2019-12-31 MED ORDER — NORETHINDRONE ACET-ETHINYL EST 1-20 MG-MCG PO TABS: 1.0000 | ORAL_TABLET | Freq: Every day | ORAL | 11 refills | Status: DC

## 2019-12-31 NOTE — Assessment & Plan Note (Signed)
Minimal use of methylphenidate causes headache .  Trial of alternative

## 2019-12-31 NOTE — Assessment & Plan Note (Signed)
Multifactorial ,  CTS and cervical radiculopathy.  Chiropractor helping the radiculopathic component.

## 2019-12-31 NOTE — Progress Notes (Signed)
Virtual Visit via [place]  This visit type was conducted due to national recommendations for restrictions regarding the COVID-19 pandemic (e.g. social distancing).  This format is felt to be most appropriate for this patient at this time.  All issues noted in this document were discussed and addressed.  No physical exam was performed (except for noted visual exam findings with Video Visits).   I connected with@ on 12/31/19 at  8:00 AM EDT by a video enabled telemedicine applicationand verified that I am speaking with the correct person using two identifiers. Location patient: home Location provider: work or home office Persons participating in the virtual visit: patient, provider  I discussed the limitations, risks, security and privacy concerns of performing an evaluation and management service by telephone and the availability of in person appointments. I also discussed with the patient that there may be a patient responsible charge related to this service. The patient expressed understanding and agreed to proceed. \ Reason for visit:  Follow up on ADD, other issues   HPI:  Seeing  B[facility] regularly for treatment of bilateral radiculopathy  Due to presumed due to DDD cervical spine   Nocturnal numbness is gradually improving.    Hands are Still numb in the morning ,   Left greater than right, and numbness resolves  after one hour.  Aggravated by drying hair.  Has documented  CTS by EMG/NS studies done  In 2019,  L moderate,  R mild   Tried using nighttime Splints which did not help.  No loss of strength or tremor .   ADD:  Using Adderal 1/2 tablet on average 1-2 times per week, but use of medication Causes headache.    ROS: See pertinent positives and negatives per HPI.  Past Medical History:  Diagnosis Date  . ADD (attention deficit disorder)   . Cervical disc disorder with radiculopathy of cervical region   . Depression   . Heart palpitations   . Raynaud's disease 2013  .  Raynaud's syndrome without gangrene     Past Surgical History:  Procedure Laterality Date  . BREAST BIOPSY Left 11-11-14   FIBROADENOMA core  . CESAREAN SECTION  02/2003, 11/2005  . COLONOSCOPY WITH PROPOFOL N/A 12/25/2016   Procedure: COLONOSCOPY WITH PROPOFOL;  Surgeon: Lollie Sails, MD;  Location: [facility] ENDOSCOPY;  Service: Endoscopy;  Laterality: N/A;  . ESOPHAGOGASTRODUODENOSCOPY (EGD) WITH PROPOFOL N/A 12/25/2016   Procedure: ESOPHAGOGASTRODUODENOSCOPY (EGD) WITH PROPOFOL;  Surgeon: Lollie Sails, MD;  Location: [facility] ENDOSCOPY;  Service: Endoscopy;  Laterality: N/A;    Family History  Problem Relation Age of Onset  . Heart disease Father   . Hypertension Father   . Cancer Mother        squamous cell  . Cancer Maternal Grandfather        leukemia  . Stroke Paternal Grandmother   . Stroke Paternal Grandfather   . Breast cancer Neg Hx     SOCIAL HX:  reports that she has never smoked. She has never used smokeless tobacco. She reports current alcohol use of about 5.0 standard drinks of alcohol per week. She reports that she does not use drugs.   Current Outpatient Medications:  .  etonogestrel (NEXPLANON) 68 MG IMPL implant, Inject into the skin., Disp: , Rfl:  .  meloxicam (MOBIC) 7.5 MG tablet, Take 1 tablet (7.5 mg total) by mouth daily., Disp: 90 tablet, Rfl: 0 .  sertraline (ZOLOFT) 50 MG tablet, Take 1 tablet (50 mg total) by mouth daily., Disp:  90 tablet, Rfl: 0 .  valACYclovir (VALTREX) 500 MG tablet, TAKE 1 TABLET BY MOUTH EVERY DAY, Disp: 90 tablet, Rfl: 1 .  amphetamine-dextroamphetamine (ADDERALL) 5 MG tablet, Take 1 tablet (5 mg total) by mouth daily., Disp: 30 tablet, Rfl: 0 .  norethindrone-ethinyl estradiol (LOESTRIN 1/20, 21,) 1-20 MG-MCG tablet, Take 1 tablet by mouth daily., Disp: 1 Package, Rfl: 11  EXAM:  VITALS per patient if applicable:  GENERAL: alert, oriented, appears well and in no acute distress  HEENT: atraumatic, conjunttiva clear, no  obvious abnormalities on inspection of external nose and ears  NECK: normal movements of the head and neck  LUNGS: on inspection no signs of respiratory distress, breathing rate appears normal, no obvious gross SOB, gasping or wheezing  CV: no obvious cyanosis  MS: moves all visible extremities without noticeable abnormality  PSYCH/NEURO: pleasant and cooperative, no obvious depression or anxiety, speech and thought processing grossly intact  ASSESSMENT AND PLAN:  Discussed the following assessment and plan:  Encounter for screening mammogram for malignant neoplasm of breast - Plan: MM 3D SCREEN BREAST BILATERAL  Numbness and tingling in both hands - Plan: TSH, Vitamin B12  Oral contraception initial prescription - Plan: Comprehensive metabolic panel, CBC with Differential/Platelet  Moderate mixed hyperlipidemia not requiring statin therapy - Plan: Lipid panel  Attention deficit hyperactivity disorder (ADHD), predominantly inattentive type  Bilateral hand numbness  Carpal tunnel syndrome, bilateral  Implanon in place  Attention deficit hyperactivity disorder (ADHD), predominantly inattentive type Minimal use of methylphenidate causes headache .  Trial of alternative   Bilateral hand numbness Multifactorial ,  CTS and cervical radiculopathy.  Chiropractor helping the radiculopathic component.   Carpal tunnel syndrome, bilateral No progression thus far  Implanon in place Her implanon is over a year expired.  She will make an appointment with GYN.  Oral contraceptives prescribed in the interim    I discussed the assessment and treatment plan with the patient. The patient was provided an opportunity to ask questions and all were answered. The patient agreed with the plan and demonstrated an understanding of the instructions.   The patient was advised to call back or seek an in-person evaluation if the symptoms worsen or if the condition fails to improve as anticipated.  I  provided  30 minutes of non-face-to-face time during this encounter reviewing patient's current problems and past surgeries, labs and imaging studies, providing counseling on the above mentioned problems , and coordination  of care .   Crecencio Mc, MD

## 2019-12-31 NOTE — Assessment & Plan Note (Signed)
No progression thus far

## 2019-12-31 NOTE — Assessment & Plan Note (Signed)
Her implanon is over a year expired.  She will make an appointment with GYN.  Oral contraceptives prescribed in the interim

## 2020-01-02 ENCOUNTER — Telehealth: Payer: Self-pay

## 2020-01-02 ENCOUNTER — Other Ambulatory Visit: Payer: Self-pay

## 2020-01-02 ENCOUNTER — Other Ambulatory Visit (INDEPENDENT_AMBULATORY_CARE_PROVIDER_SITE_OTHER): Payer: PRIVATE HEALTH INSURANCE

## 2020-01-02 DIAGNOSIS — R202 Paresthesia of skin: Secondary | ICD-10-CM

## 2020-01-02 DIAGNOSIS — Z30011 Encounter for initial prescription of contraceptive pills: Secondary | ICD-10-CM | POA: Diagnosis not present

## 2020-01-02 DIAGNOSIS — R200 Anesthesia of skin: Secondary | ICD-10-CM

## 2020-01-02 DIAGNOSIS — E782 Mixed hyperlipidemia: Secondary | ICD-10-CM | POA: Diagnosis not present

## 2020-01-02 LAB — COMPREHENSIVE METABOLIC PANEL
ALT: 19 U/L (ref 0–35)
AST: 25 U/L (ref 0–37)
Albumin: 4.5 g/dL (ref 3.5–5.2)
Alkaline Phosphatase: 52 U/L (ref 39–117)
BUN: 12 mg/dL (ref 6–23)
CO2: 31 mEq/L (ref 19–32)
Calcium: 9.2 mg/dL (ref 8.4–10.5)
Chloride: 101 mEq/L (ref 96–112)
Creatinine, Ser: 0.7 mg/dL (ref 0.40–1.20)
GFR: 88.85 mL/min (ref >60.00)
Glucose, Bld: 89 mg/dL (ref 70–99)
Potassium: 4.1 mEq/L (ref 3.5–5.1)
Sodium: 135 mEq/L (ref 135–145)
Total Bilirubin: 0.6 mg/dL (ref 0.2–1.2)
Total Protein: 6.9 g/dL (ref 6.0–8.3)

## 2020-01-02 LAB — CBC WITH DIFFERENTIAL/PLATELET
Basophils Absolute: 0 10*3/uL (ref 0.0–0.1)
Basophils Relative: 0.9 % (ref 0.0–3.0)
Eosinophils Absolute: 0.2 10*3/uL (ref 0.0–0.7)
Eosinophils Relative: 4.3 % (ref 0.0–5.0)
HCT: 41.9 % (ref 36.0–46.0)
Hemoglobin: 14.2 g/dL (ref 12.0–15.0)
Lymphocytes Relative: 37.3 % (ref 12.0–46.0)
Lymphs Abs: 2 10*3/uL (ref 0.7–4.0)
MCHC: 33.8 g/dL (ref 30.0–36.0)
MCV: 106.1 fl — ABNORMAL HIGH (ref 78.0–100.0)
Monocytes Absolute: 0.4 10*3/uL (ref 0.1–1.0)
Monocytes Relative: 8.1 % (ref 3.0–12.0)
Neutro Abs: 2.7 10*3/uL (ref 1.4–7.7)
Neutrophils Relative %: 49.4 % (ref 43.0–77.0)
Platelets: 238 10*3/uL (ref 150.0–400.0)
RBC: 3.94 Mil/uL (ref 3.87–5.11)
RDW: 13.8 % (ref 11.5–15.5)
WBC: 5.5 10*3/uL (ref 4.0–10.5)

## 2020-01-02 LAB — LIPID PANEL
Cholesterol: 212 mg/dL — ABNORMAL HIGH (ref 0–200)
HDL: 58.9 mg/dL (ref >39.00)
LDL Cholesterol: 138 mg/dL — ABNORMAL HIGH (ref 0–99)
NonHDL: 153.26
Total CHOL/HDL Ratio: 4
Triglycerides: 75 mg/dL (ref 0.0–149.0)
VLDL: 15 mg/dL (ref 0.0–40.0)

## 2020-01-02 LAB — VITAMIN B12: Vitamin B-12: 632 pg/mL (ref 211–911)

## 2020-01-02 LAB — TSH: TSH: 0.62 u[IU]/mL (ref 0.35–4.50)

## 2020-01-02 NOTE — Telephone Encounter (Signed)
PA for Adderall has been submitted on covermymeds.

## 2020-01-08 ENCOUNTER — Other Ambulatory Visit: Payer: Self-pay | Admitting: Internal Medicine

## 2020-02-03 LAB — HM PAP SMEAR: HM Pap smear: NORMAL

## 2020-02-12 MED ORDER — VALACYCLOVIR HCL 500 MG PO TABS: 500.0000 mg | ORAL_TABLET | Freq: Every day | ORAL | 1 refills | Status: DC

## 2020-03-04 ENCOUNTER — Ambulatory Visit
Admission: RE | Admit: 2020-03-04 | Discharge: 2020-03-04 | Disposition: A | Discharge status: Home / Self Care | Payer: PRIVATE HEALTH INSURANCE | Source: Ambulatory Visit | Attending: Internal Medicine | Admitting: Internal Medicine

## 2020-03-04 DIAGNOSIS — Z1231 Encounter for screening mammogram for malignant neoplasm of breast: Secondary | ICD-10-CM | POA: Diagnosis present

## 2020-03-12 ENCOUNTER — Other Ambulatory Visit: Payer: Self-pay | Admitting: Internal Medicine

## 2020-03-12 DIAGNOSIS — N3090 Cystitis, unspecified without hematuria: Secondary | ICD-10-CM | POA: Insufficient documentation

## 2020-03-12 MED ORDER — CIPROFLOXACIN HCL 250 MG PO TABS: 250.0000 mg | ORAL_TABLET | Freq: Two times a day (BID) | ORAL | 0 refills | Status: DC

## 2020-03-12 NOTE — Assessment & Plan Note (Signed)
2nd occurrence in 6 months.  Empiric Cipro ordered given the weekend.  Patient advised to increase water intake for a day, and if symptoms persist, to start the antibitoic.  Urine culture and UA needed if symptoms do not resolve with empiric cipro.

## 2020-03-17 ENCOUNTER — Ambulatory Visit: Payer: Self-pay | Admitting: Podiatry

## 2020-03-22 ENCOUNTER — Other Ambulatory Visit: Payer: Self-pay | Admitting: Internal Medicine

## 2020-03-23 ENCOUNTER — Telehealth: Payer: Self-pay | Admitting: Internal Medicine

## 2020-03-23 DIAGNOSIS — R350 Frequency of micturition: Secondary | ICD-10-CM

## 2020-03-23 NOTE — Telephone Encounter (Signed)
YES SHE NEEDS TO  SUBMIT A URINE SPECIMEN AND A FOLLOW UP APPT

## 2020-03-23 NOTE — Telephone Encounter (Signed)
Would you like for pt to schedule an appointment for lab and office appt with you. I do not see where a UA or culture has been done.

## 2020-03-23 NOTE — Telephone Encounter (Signed)
Pt called and said that she is still having UTI symptoms and the  ciprofloxacin (CIPRO) 250 MG tablet hasn't helped

## 2020-03-24 ENCOUNTER — Other Ambulatory Visit: Payer: Self-pay

## 2020-03-24 ENCOUNTER — Other Ambulatory Visit (INDEPENDENT_AMBULATORY_CARE_PROVIDER_SITE_OTHER): Payer: PRIVATE HEALTH INSURANCE

## 2020-03-24 DIAGNOSIS — R350 Frequency of micturition: Secondary | ICD-10-CM | POA: Diagnosis not present

## 2020-03-24 NOTE — Addendum Note (Signed)
Addended by: Adair Laundry on: 03/24/2020 10:04 AM   Modules accepted: Orders

## 2020-03-24 NOTE — Telephone Encounter (Signed)
Spoke with pt and she has been scheduled for both lab and virtual visit.

## 2020-03-25 ENCOUNTER — Telehealth (INDEPENDENT_AMBULATORY_CARE_PROVIDER_SITE_OTHER): Payer: PRIVATE HEALTH INSURANCE | Admitting: Internal Medicine

## 2020-03-25 ENCOUNTER — Encounter: Payer: Self-pay | Admitting: Internal Medicine

## 2020-03-25 DIAGNOSIS — R102 Pelvic and perineal pain: Secondary | ICD-10-CM

## 2020-03-25 DIAGNOSIS — R87810 Cervical high risk human papillomavirus (HPV) DNA test positive: Secondary | ICD-10-CM

## 2020-03-25 LAB — URINALYSIS, ROUTINE W REFLEX MICROSCOPIC
Bilirubin Urine: NEGATIVE
Hgb urine dipstick: NEGATIVE
Ketones, ur: 15 — AB
Leukocytes,Ua: NEGATIVE
Nitrite: NEGATIVE
RBC / HPF: NONE SEEN (ref 0–?)
Specific Gravity, Urine: 1.01 (ref 1.000–1.030)
Total Protein, Urine: NEGATIVE
Urine Glucose: NEGATIVE
Urobilinogen, UA: 0.2 (ref 0.0–1.0)
pH: 7 (ref 5.0–8.0)

## 2020-03-25 LAB — URINE CULTURE
MICRO NUMBER:: 10759972
SPECIMEN QUALITY:: ADEQUATE

## 2020-03-25 NOTE — Progress Notes (Signed)
Virtual Visit via [place]  This visit type was conducted due to national recommendations for restrictions regarding the COVID-19 pandemic (e.g. social distancing).  This format is felt to be most appropriate for this patient at this time.  All issues noted in this document were discussed and addressed.  No physical exam was performed (except for noted visual exam findings with Video Visits).   I connected with@ on 03/25/20 at 11:00 AM EDT by a video enabled telemedicine application  and verified that I am speaking with the correct person using two identifiers. Location patient: home Location provider: work or home office Persons participating in the virtual visit: patient, provider  I discussed the limitations, risks, security and privacy concerns of performing an evaluation and management service by telephone and the availability of in person appointments. I also discussed with the patient that there may be a patient responsible charge related to this service. The patient expressed understanding and agreed to proceed.  Reason for visit: PERSISTENT  suprapubic discomfort   HPI:  49 yr old sexually active female presents with persistent suprapubic discomfort  following empiric treatment for UTI with Cipro 250 mg bid x 5 days started  on July 16.  Symptoms at the beginning were  having pressure and frequency . UA and culture  were obtained at 3 pm on July 28  And at time of note completion  show no evidence of inflammation or infection . She denies dysuria   Recent PAP done 02/18/20 by  GYN at [facility]  STD screen negative but She was  HPV positive for one or more of the high risk types, which is new for her.   PAP was normal.   She has only had one partner since her divorce and is concerned that her boyfriend may have given her HPV.  Mother had cervical CA   REcords REViewed online with patient via Epic portal   ROS: See pertinent positives and negatives per HPI.  Past Medical History:   Diagnosis Date  . ADD (attention deficit disorder)   . Cervical disc disorder with radiculopathy of cervical region   . Depression   . Heart palpitations   . Raynaud's disease 2013  . Raynaud's syndrome without gangrene     Past Surgical History:  Procedure Laterality Date  . AUGMENTATION MAMMAPLASTY    . BREAST BIOPSY Left 11-11-14   FIBROADENOMA core  . CESAREAN SECTION  02/2003, 11/2005  . COLONOSCOPY WITH PROPOFOL N/A 12/25/2016   Procedure: COLONOSCOPY WITH PROPOFOL;  Surgeon: Lollie Sails, MD;  Location: [facility] ENDOSCOPY;  Service: Endoscopy;  Laterality: N/A;  . ESOPHAGOGASTRODUODENOSCOPY (EGD) WITH PROPOFOL N/A 12/25/2016   Procedure: ESOPHAGOGASTRODUODENOSCOPY (EGD) WITH PROPOFOL;  Surgeon: Lollie Sails, MD;  Location: [facility] ENDOSCOPY;  Service: Endoscopy;  Laterality: N/A;    Family History  Problem Relation Age of Onset  . Heart disease Father   . Hypertension Father   . Cancer Mother        cervical   . Cancer Maternal Grandfather        leukemia  . Stroke Paternal Grandmother   . Stroke Paternal Grandfather   . Breast cancer Neg Hx     SOCIAL HX:  reports that she has never smoked. She has never used smokeless tobacco. She reports current alcohol use of about 5.0 standard drinks of alcohol per week. She reports that she does not use drugs.   Current Outpatient Medications:  .  amphetamine-dextroamphetamine (ADDERALL) 5 MG tablet, Take 1 tablet (  5 mg total) by mouth daily., Disp: 30 tablet, Rfl: 0 .  meloxicam (MOBIC) 7.5 MG tablet, Take 1 tablet (7.5 mg total) by mouth daily., Disp: 90 tablet, Rfl: 0 .  norethindrone-ethinyl estradiol (LOESTRIN 1/20, 21,) 1-20 MG-MCG tablet, Take 1 tablet by mouth daily., Disp: 1 Package, Rfl: 11 .  sertraline (ZOLOFT) 50 MG tablet, TAKE 1 TABLET(50 MG) BY MOUTH DAILY, Disp: 90 tablet, Rfl: 0 .  valACYclovir (VALTREX) 500 MG tablet, Take 1 tablet (500 mg total) by mouth daily., Disp: 90 tablet, Rfl: 1  EXAM:  VITALS per  patient if applicable:  GENERAL: alert, oriented, appears well and in no acute distress  HEENT: atraumatic, conjunttiva clear, no obvious abnormalities on inspection of external nose and ears  NECK: normal movements of the head and neck  LUNGS: on inspection no signs of respiratory distress, breathing rate appears normal, no obvious gross SOB, gasping or wheezing  CV: no obvious cyanosis  MS: moves all visible extremities without noticeable abnormality  PSYCH/NEURO: pleasant and cooperative, no obvious depression or anxiety, speech and thought processing grossly intact  ASSESSMENT AND PLAN:  Discussed the following assessment and plan:  Suprapubic pressure - Plan: Ambulatory referral to Urology  Cervical high risk HPV (human papillomavirus) test positive  Suprapubic pressure Present for 2 weeks, no change with empiric treatment for UTI.  Absence of UTI /inflammatory cells confirmed with UA and culture. Refer to Hollice Espy for urologic evaluation   Cervical high risk HPV (human papillomavirus) test positive By July 2021 PAP smear done at [facility]. Reviewed with patient today     I discussed the assessment and treatment plan with the patient. The patient was provided an opportunity to ask questions and all were answered. The patient agreed with the plan and demonstrated an understanding of the instructions.   The patient was advised to call back or seek an in-person evaluation if the symptoms worsen or if the condition fails to improve as anticipated.  I provided 30 minutes of non-face-to-face time during this encounter.   Crecencio Mc, MD

## 2020-03-26 ENCOUNTER — Encounter: Payer: Self-pay | Admitting: Internal Medicine

## 2020-03-26 DIAGNOSIS — R102 Pelvic and perineal pain: Secondary | ICD-10-CM | POA: Insufficient documentation

## 2020-03-26 DIAGNOSIS — R87810 Cervical high risk human papillomavirus (HPV) DNA test positive: Secondary | ICD-10-CM | POA: Insufficient documentation

## 2020-03-26 NOTE — Assessment & Plan Note (Signed)
By July 2021 PAP smear done at [facility]. Reviewed with patient today

## 2020-03-26 NOTE — Assessment & Plan Note (Signed)
Present for 2 weeks, no change with empiric treatment for UTI.  Absence of UTI /inflammatory cells confirmed with UA and culture. Refer to Hollice Espy for urologic evaluation

## 2020-04-02 ENCOUNTER — Encounter: Payer: Self-pay | Admitting: Urology

## 2020-04-05 ENCOUNTER — Telehealth: Payer: Self-pay | Admitting: Internal Medicine

## 2020-04-05 NOTE — Telephone Encounter (Signed)
1 vm left for pt" [place] said 4 days ago  "Rejection Reason - Patient did not respond" [place] said about 3 hours ago

## 2020-04-09 MED ORDER — AMPHETAMINE-DEXTROAMPHETAMINE 5 MG PO TABS: 5.0000 mg | ORAL_TABLET | Freq: Every day | ORAL | 0 refills | Status: DC

## 2020-04-09 MED ORDER — VALACYCLOVIR HCL 500 MG PO TABS: 500.0000 mg | ORAL_TABLET | Freq: Every day | ORAL | 1 refills | Status: DC

## 2020-06-14 ENCOUNTER — Encounter: Payer: Self-pay | Admitting: Internal Medicine

## 2020-06-14 ENCOUNTER — Other Ambulatory Visit: Payer: Self-pay

## 2020-06-14 ENCOUNTER — Telehealth (INDEPENDENT_AMBULATORY_CARE_PROVIDER_SITE_OTHER): Payer: PRIVATE HEALTH INSURANCE | Admitting: Internal Medicine

## 2020-06-14 DIAGNOSIS — F411 Generalized anxiety disorder: Secondary | ICD-10-CM | POA: Diagnosis not present

## 2020-06-14 DIAGNOSIS — R87810 Cervical high risk human papillomavirus (HPV) DNA test positive: Secondary | ICD-10-CM | POA: Diagnosis not present

## 2020-06-14 DIAGNOSIS — F900 Attention-deficit hyperactivity disorder, predominantly inattentive type: Secondary | ICD-10-CM | POA: Diagnosis not present

## 2020-06-14 DIAGNOSIS — F430 Acute stress reaction: Secondary | ICD-10-CM

## 2020-06-14 MED ORDER — SERTRALINE HCL 50 MG PO TABS: ORAL_TABLET | ORAL | 1 refills | Status: DC

## 2020-06-14 NOTE — Progress Notes (Signed)
Virtual Visit via [employer]   This visit type was conducted due to national recommendations for restrictions regarding the COVID-19 pandemic (e.g. social distancing).  This format is felt to be most appropriate for this patient at this time.  All issues noted in this document were discussed and addressed.  No physical exam was performed (except for noted visual exam findings with Video Visits).   I connected with@ on 06/14/20 at  4:00 PM EDT by a video enabled telemedicine applicationand verified that I am speaking with the correct person using two identifiers. Location patient: home Location provider: work or home office Persons participating in the virtual visit: patient, provider  I discussed the limitations, risks, security and privacy concerns of performing an evaluation and management service by telephone and the availability of in person appointments. I also discussed with the patient that there may be a patient responsible charge related to this service. The patient expressed understanding and agreed to proceed.  Reason for visit:  Follow up on ADD, anxiety    HPI:  49 yr old female with history of major depressive disorder  complicated by History of ADD.  She feels generally well.  Since her last OV she has started a new business  Museum/gallery conservator) and has lost weight intentionally by restricting her diet and exercising. She continues to experience  domestic stressors due to conflict with her separated alcoholic husband and is still in the midst of divorce proceedings and notes some friction with her oldest son, who is 22,   after he visits with his father.  She is,  however happy and enjoying her business.  She is taking the adderall rarely, uses it in the morning and notes that it wears off by 3 pm.  Taking zoloft in the am with good control of anxiety.     ROS: See pertinent positives and negatives per HPI.  Past Medical History:  Diagnosis Date  . ADD (attention deficit  disorder)   . Cervical disc disorder with radiculopathy of cervical region   . Depression   . Heart palpitations   . Raynaud's disease 2013  . Raynaud's syndrome without gangrene     Past Surgical History:  Procedure Laterality Date  . AUGMENTATION MAMMAPLASTY    . BREAST BIOPSY Left 11-11-14   FIBROADENOMA core  . CESAREAN SECTION  02/2003, 11/2005  . COLONOSCOPY WITH PROPOFOL N/A 12/25/2016   Procedure: COLONOSCOPY WITH PROPOFOL;  Surgeon: Lollie Sails, MD;  Location: [facility] ENDOSCOPY;  Service: Endoscopy;  Laterality: N/A;  . ESOPHAGOGASTRODUODENOSCOPY (EGD) WITH PROPOFOL N/A 12/25/2016   Procedure: ESOPHAGOGASTRODUODENOSCOPY (EGD) WITH PROPOFOL;  Surgeon: Lollie Sails, MD;  Location: [facility] ENDOSCOPY;  Service: Endoscopy;  Laterality: N/A;    Family History  Problem Relation Age of Onset  . Heart disease Father   . Hypertension Father   . Cancer Mother        cervical   . Cancer Maternal Grandfather        leukemia  . Stroke Paternal Grandmother   . Stroke Paternal Grandfather   . Breast cancer Neg Hx     SOCIAL HX:  reports that she has never smoked. She has never used smokeless tobacco. She reports current alcohol use of about 5.0 standard drinks of alcohol per week. She reports that she does not use drugs.   Current Outpatient Medications:  .  amphetamine-dextroamphetamine (ADDERALL) 5 MG tablet, Take 1 tablet (5 mg total) by mouth daily., Disp: 30 tablet, Rfl: 0 .  sertraline (ZOLOFT)  50 MG tablet, TAKE 1 TABLET(50 MG) BY MOUTH DAILY, Disp: 90 tablet, Rfl: 1 .  valACYclovir (VALTREX) 500 MG tablet, Take 1 tablet (500 mg total) by mouth daily., Disp: 90 tablet, Rfl: 1 .  meloxicam (MOBIC) 7.5 MG tablet, Take 1 tablet (7.5 mg total) by mouth daily. (Patient not taking: Reported on 06/14/2020), Disp: 90 tablet, Rfl: 0  EXAM:  VITALS per patient if applicable:  GENERAL: alert, oriented, appears well and in no acute distress  HEENT: atraumatic, conjunttiva clear,  no obvious abnormalities on inspection of external nose and ears  NECK: normal movements of the head and neck  LUNGS: on inspection no signs of respiratory distress, breathing rate appears normal, no obvious gross SOB, gasping or wheezing  CV: no obvious cyanosis  MS: moves all visible extremities without noticeable abnormality  PSYCH/NEURO: pleasant and cooperative, no obvious depression or anxiety, speech and thought processing grossly intact  ASSESSMENT AND PLAN:  Discussed the following assessment and plan:  Cervical high risk HPV (human papillomavirus) test positive  Attention deficit hyperactivity disorder (ADHD), predominantly inattentive type  Anxiety as acute reaction to exceptional stress  Cervical high risk HPV (human papillomavirus) test positive She continues annual surveillance with OB/GYN and is up to date.  Last PAP was normal ,  HPV positive June 2021   Attention deficit hyperactivity disorder (ADHD), predominantly inattentive type Minimal use of methylphenidate , as it caused headache .  Trial of alternative , adderall was tolerated and is used sparingly   Anxiety as acute reaction to exceptional stress Controlled despite increased stressors;  continue daily use of sertraline     I discussed the assessment and treatment plan with the patient. The patient was provided an opportunity to ask questions and all were answered. The patient agreed with the plan and demonstrated an understanding of the instructions.   The patient was advised to call back or seek an in-person evaluation if the symptoms worsen or if the condition fails to improve as anticipated.  I provided 20 minutes of face-to-face time during this encounter.   Crecencio Mc, MD

## 2020-06-15 NOTE — Assessment & Plan Note (Signed)
Controlled despite increased stressors;  continue daily use of sertraline

## 2020-06-15 NOTE — Assessment & Plan Note (Signed)
Minimal use of methylphenidate , as it caused headache .  Trial of alternative , adderall was tolerated and is used sparingly

## 2020-06-15 NOTE — Assessment & Plan Note (Addendum)
She continues annual surveillance with OB/GYN and is up to date.  Last PAP was normal ,  HPV positive June 2021

## 2020-06-20 ENCOUNTER — Other Ambulatory Visit: Payer: Self-pay | Admitting: Internal Medicine

## 2020-11-01 ENCOUNTER — Other Ambulatory Visit: Payer: Self-pay | Admitting: Internal Medicine

## 2020-12-13 ENCOUNTER — Encounter: Payer: PRIVATE HEALTH INSURANCE | Admitting: Internal Medicine

## 2020-12-16 ENCOUNTER — Encounter: Payer: PRIVATE HEALTH INSURANCE | Admitting: Internal Medicine

## 2020-12-21 ENCOUNTER — Other Ambulatory Visit: Payer: Self-pay | Admitting: Internal Medicine

## 2021-01-04 ENCOUNTER — Other Ambulatory Visit: Payer: Self-pay

## 2021-01-04 ENCOUNTER — Encounter: Payer: Self-pay | Admitting: Internal Medicine

## 2021-01-04 ENCOUNTER — Ambulatory Visit (INDEPENDENT_AMBULATORY_CARE_PROVIDER_SITE_OTHER): Payer: PRIVATE HEALTH INSURANCE | Admitting: Internal Medicine

## 2021-01-04 VITALS — BP 100/72 | HR 90 | Temp 97.2°F | Resp 14 | Ht 64.0 in | Wt 126.0 lb

## 2021-01-04 DIAGNOSIS — G4709 Other insomnia: Secondary | ICD-10-CM

## 2021-01-04 DIAGNOSIS — G5603 Carpal tunnel syndrome, bilateral upper limbs: Secondary | ICD-10-CM

## 2021-01-04 DIAGNOSIS — Z9229 Personal history of other drug therapy: Secondary | ICD-10-CM | POA: Diagnosis not present

## 2021-01-04 DIAGNOSIS — E782 Mixed hyperlipidemia: Secondary | ICD-10-CM

## 2021-01-04 DIAGNOSIS — Z83719 Family history of colon polyps, unspecified: Secondary | ICD-10-CM

## 2021-01-04 DIAGNOSIS — Z1231 Encounter for screening mammogram for malignant neoplasm of breast: Secondary | ICD-10-CM

## 2021-01-04 DIAGNOSIS — Z0000 Encounter for general adult medical examination without abnormal findings: Secondary | ICD-10-CM | POA: Diagnosis not present

## 2021-01-04 DIAGNOSIS — E538 Deficiency of other specified B group vitamins: Secondary | ICD-10-CM

## 2021-01-04 DIAGNOSIS — N951 Menopausal and female climacteric states: Secondary | ICD-10-CM | POA: Diagnosis not present

## 2021-01-04 DIAGNOSIS — Z8371 Family history of colonic polyps: Secondary | ICD-10-CM

## 2021-01-04 DIAGNOSIS — Z13220 Encounter for screening for lipoid disorders: Secondary | ICD-10-CM | POA: Diagnosis not present

## 2021-01-04 MED ORDER — TRAZODONE HCL 50 MG PO TABS: 25.0000 mg | ORAL_TABLET | Freq: Every evening | ORAL | 3 refills | Status: DC | PRN

## 2021-01-04 MED ORDER — AMPHETAMINE-DEXTROAMPHETAMINE 5 MG PO TABS: 5.0000 mg | ORAL_TABLET | Freq: Every day | ORAL | 0 refills | Status: DC

## 2021-01-04 NOTE — Patient Instructions (Addendum)
Ok to increase gabapentin to 200 mg at bedtime.  Increase gradually if needed ,  Do not go up by more than 100 mg in any given week  Trial of trazodone as alternative to benadryl, it is Not habit forming.  Return for fasting labs   Your annual mammogram has been ordered.  You are encouraged (required) to call to make your appointment at [facility]  Your last PAP was 2021.  I recommend repeating it since it was hpv positive

## 2021-01-04 NOTE — Progress Notes (Addendum)
Patient ID: Kristi Spence, female    DOB: 1971/08/11  Age: 50 y.o. MRN: [id number]  The patient is here for annual preventive examination and  Review of other chronic and acute problems.  This visit occurred during the SARS-CoV-2 public health emergency.  Safety protocols were in place, including screening questions prior to the visit, additional usage of staff PPE, and extensive cleaning of exam room while observing appropriate contact time as indicated for disinfecting solutions.     The risk factors are reflected in the social history.  The roster of all physicians providing medical care to patient - is listed in the Snapshot section of the chart.  Activities of daily living:  The patient is 100% independent in all ADLs: dressing, toileting, feeding as well as independent mobility  Home safety : The patient has smoke detectors in the home. They wear seatbelts.  There are no firearms at home. There is no violencein the home.   There is no risks for hepatitis, STDs or HIV. There is no   history of blood transfusion. They have no travel history to infectious disease endemic areas of the world.  The patient has seen their dentist in the last six month. They have seen their eye doctor in the last year. She denies hearing difficulty with regard to whispered voices and some television programs.    They do not  have excessive sun exposure. Discussed the need for sun protection: hats, long sleeves and use of sunscreen if there is significant sun exposure.   Diet: the importance of a healthy diet is discussed. They do have a healthy diet.  The benefits of regular aerobic exercise were discussed. She runs 4 times per week ,  60 minutes.   Depression screen: there are no signs or vegative symptoms of depression- irritability, change in appetite, anhedonia, sadness/tearfullness.  The following portions of the patient's history were reviewed and updated as appropriate: allergies, current  medications, past family history, past medical history,  past surgical history, past social history  and problem list.  Visual acuity was not assessed per patient preference since she has regular follow up with her ophthalmologist. Hearing and body mass index were assessed and reviewed.   During the course of the visit the patient was educated and counseled about appropriate screening and preventive services including : fall prevention , diabetes screening, nutrition counseling, colorectal cancer screening, and recommended immunizations.    CC: The primary encounter diagnosis was Breast cancer screening by mammogram. Diagnoses of B12 deficiency, Perimenopause, COVID-19 vaccine series completed, Screening for hyperlipidemia, Moderate mixed hyperlipidemia not requiring statin therapy, Encounter for preventive health examination, Family history of colonic polyps, Other insomnia, and Carpal tunnel syndrome, bilateral were also pertinent to this visit.  1) has had rheumatology evaluation by Dr Posey Pronto at [facility], [facility] :  Mild OA,  CTS diagnosed.  gabapentin and meloxican prescribed.  Follow up May 31.     2) Patient  has been having some trouble sleeping.  Wakes up 2 to 3 times per night . Bedtime hygiene reviewed,  Patient has been using an electronic book to read before bed.  Does not drink caffeinated beverages after 3 PM.  Only voids  bladder once per night.  No snoring partner. Occasionally has more than one drink per night.  Not exercising excessively in the evening. Patient does have a history of anxiety but does not lie awake worrying about issues that cannot be resolved. Patient does take stimulants for  management of ADD .   History Kristi Spence has a past medical history of ADD (attention deficit disorder), Cervical disc disorder with radiculopathy of cervical region, Depression, Heart palpitations, Raynaud's disease (2013), and Raynaud's syndrome without gangrene.   She  has a past surgical history that includes Cesarean section (02/2003, 11/2005); Colonoscopy with propofol (N/A, 12/25/2016); Esophagogastroduodenoscopy (egd) with propofol (N/A, 12/25/2016); Breast biopsy (Left, 11-11-14); and Augmentation mammaplasty.   Her family history includes Cancer in her maternal grandfather and mother; Heart disease in her father; Hypertension in her father; Stroke in her paternal grandfather and paternal grandmother.She reports that she has never smoked. She has never used smokeless tobacco. She reports current alcohol use of about 5.0 standard drinks of alcohol per week. She reports that she does not use drugs.  Outpatient Medications Prior to Visit  Medication Sig Dispense Refill  . gabapentin (NEURONTIN) 100 MG capsule Take by mouth.    . meloxicam (MOBIC) 15 MG tablet Take 1 tablet by mouth daily.    . sertraline (ZOLOFT) 50 MG tablet TAKE 1 TABLET(50 MG) BY MOUTH DAILY 90 tablet 1  . valACYclovir (VALTREX) 500 MG tablet TAKE 1 TABLET(500 MG) BY MOUTH DAILY 90 tablet 1  . amphetamine-dextroamphetamine (ADDERALL) 5 MG tablet Take 1 tablet (5 mg total) by mouth daily. 30 tablet 0  . meloxicam (MOBIC) 7.5 MG tablet Take 1 tablet (7.5 mg total) by mouth daily. (Patient not taking: Reported on 01/04/2021) 90 tablet 0   No facility-administered medications prior to visit.    Review of Systems   Patient denies headache, fevers, malaise, unintentional weight loss, skin rash, eye pain, sinus congestion and sinus pain, sore throat, dysphagia,  hemoptysis , cough, dyspnea, wheezing, chest pain, palpitations, orthopnea, edema, abdominal pain, nausea, melena, diarrhea, constipation, flank pain, dysuria, hematuria, urinary  Frequency, nocturia, numbness, tingling, seizures,  Focal weakness, Loss of consciousness,  Tremor, insomnia, depression, anxiety, and suicidal ideation.     Objective:  BP 100/72 (BP Location: Left Arm, Patient Position: Sitting, Cuff Size: Normal)   Pulse 90    Temp (!) 97.2 F (36.2 C) (Oral)   Resp 14   Ht 5\' 4"  (1.626 m)   Wt 126 lb (57.2 kg)   SpO2 98%   BMI 21.63 kg/m   Physical Exam  General appearance: alert, cooperative and appears stated age Head: Normocephalic, without obvious abnormality, atraumatic Eyes: conjunctivae/corneas clear. PERRL, EOM's intact. Fundi benign. Ears: normal TM's and external ear canals both ears Nose: Nares normal. Septum midline. Mucosa normal. No drainage or sinus tenderness. Throat: lips, mucosa, and tongue normal; teeth and gums normal Neck: no adenopathy, no carotid bruit, no JVD, supple, symmetrical, trachea midline and thyroid not enlarged, symmetric, no tenderness/mass/nodules Lungs: clear to auscultation bilaterally Breasts: bilateral implants, normal appearance, no masses or tenderness Heart: regular rate and rhythm, S1, S2 normal, no murmur, click, rub or gallop Abdomen: soft, non-tender; bowel sounds normal; no masses,  no organomegaly Extremities: extremities normal, atraumatic, no cyanosis or edema Pulses: 2+ and symmetric Skin: Skin color, texture, turgor normal. No rashes or lesions Neurologic: Alert and oriented X 3, normal strength and tone. Normal symmetric reflexes. Normal coordination and gait.    Assessment & Plan:   Problem List Items Addressed This Visit      Unprioritized   Carpal tunnel syndrome, bilateral    Managed with gabapentin low dose with persistent symptoms. .  Advised on how to increase dose safely      Relevant Medications   gabapentin (NEURONTIN) 100  MG capsule   traZODone (DESYREL) 50 MG tablet   amphetamine-dextroamphetamine (ADDERALL) 5 MG tablet   Encounter for preventive health examination    age appropriate education and counseling updated, referrals for preventative services and immunizations addressed, dietary and smoking counseling addressed, most recent labs reviewed.  I have personally reviewed and have noted:  1) the patient's medical and social  history 2) The pt's use of alcohol, tobacco, and illicit drugs 3) The patient's current medications and supplements 4) Functional ability including ADL's, fall risk, home safety risk, hearing and visual impairment 5) Diet and physical activities 6) Evidence for depression or mood disorder 7) The patient's height, weight, and BMI have been recorded in the chart  I have made referrals, and provided counseling and education based on review of the above      Family history of colonic polyps    She had a normal colonoscopy In 2018.  Continue screening every 5 years.       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  Trial of trazodone. Advised to increase gabapentin dose to 200 mg daily        Other Visit Diagnoses    Breast cancer screening by mammogram    -  Primary   Relevant Orders   MM 3D SCREEN BREAST BILATERAL   B12 deficiency       Relevant Orders   Vitamin B12   CBC with Differential/Platelet   Perimenopause       Relevant Orders   TSH   Follicle stimulating hormone   LH   COVID-19 vaccine series completed       Relevant Orders   SARS-CoV-2 Semi-Quantitative Total Antibody, Spike   Screening for hyperlipidemia       Moderate mixed hyperlipidemia not requiring statin therapy       Relevant Orders   Lipid panel   Comprehensive metabolic panel      I am having Berniece Salines start on traZODone. I am also having her maintain her valACYclovir, sertraline, gabapentin, meloxicam, and amphetamine-dextroamphetamine.  Meds ordered this encounter  Medications  . traZODone (DESYREL) 50 MG tablet    Sig: Take 0.5-1 tablets (25-50 mg total) by mouth at bedtime as needed for sleep.    Dispense:  30 tablet    Refill:  3  . amphetamine-dextroamphetamine (ADDERALL) 5 MG tablet    Sig: Take 1 tablet (5 mg total) by mouth daily.    Dispense:  30 tablet    Refill:  0    Medications Discontinued During This Encounter  Medication Reason  .  meloxicam (MOBIC) 7.5 MG tablet   . amphetamine-dextroamphetamine (ADDERALL) 5 MG tablet Reorder    Follow-up: No follow-ups on file.   Crecencio Mc, MD

## 2021-01-06 DIAGNOSIS — G4700 Insomnia, unspecified: Secondary | ICD-10-CM | POA: Insufficient documentation

## 2021-01-06 DIAGNOSIS — Z8371 Family history of colonic polyps: Secondary | ICD-10-CM | POA: Insufficient documentation

## 2021-01-06 NOTE — Assessment & Plan Note (Addendum)
She had a normal colonoscopy In 2018.  Continue screening every 5 years.

## 2021-01-06 NOTE — Assessment & Plan Note (Signed)
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  Trial of trazodone. Advised to increase gabapentin dose to 200 mg daily

## 2021-01-06 NOTE — Assessment & Plan Note (Signed)
Managed with gabapentin low dose with persistent symptoms. .  Advised on how to increase dose safely

## 2021-01-06 NOTE — Assessment & Plan Note (Signed)

## 2021-01-17 ENCOUNTER — Other Ambulatory Visit: Payer: Self-pay

## 2021-01-17 ENCOUNTER — Other Ambulatory Visit (INDEPENDENT_AMBULATORY_CARE_PROVIDER_SITE_OTHER): Payer: PRIVATE HEALTH INSURANCE

## 2021-01-17 DIAGNOSIS — N951 Menopausal and female climacteric states: Secondary | ICD-10-CM

## 2021-01-17 DIAGNOSIS — E782 Mixed hyperlipidemia: Secondary | ICD-10-CM | POA: Diagnosis not present

## 2021-01-17 DIAGNOSIS — Z9229 Personal history of other drug therapy: Secondary | ICD-10-CM

## 2021-01-17 DIAGNOSIS — E538 Deficiency of other specified B group vitamins: Secondary | ICD-10-CM | POA: Diagnosis not present

## 2021-01-17 LAB — CBC WITH DIFFERENTIAL/PLATELET
Basophils Absolute: 0 10*3/uL (ref 0.0–0.1)
Basophils Relative: 0.7 % (ref 0.0–3.0)
Eosinophils Absolute: 0.3 10*3/uL (ref 0.0–0.7)
Eosinophils Relative: 5.4 % — ABNORMAL HIGH (ref 0.0–5.0)
HCT: 39.7 % (ref 36.0–46.0)
Hemoglobin: 13.3 g/dL (ref 12.0–15.0)
Lymphocytes Relative: 32.4 % (ref 12.0–46.0)
Lymphs Abs: 1.8 10*3/uL (ref 0.7–4.0)
MCHC: 33.6 g/dL (ref 30.0–36.0)
MCV: 105.2 fl — ABNORMAL HIGH (ref 78.0–100.0)
Monocytes Absolute: 0.4 10*3/uL (ref 0.1–1.0)
Monocytes Relative: 8.2 % (ref 3.0–12.0)
Neutro Abs: 2.9 10*3/uL (ref 1.4–7.7)
Neutrophils Relative %: 53.3 % (ref 43.0–77.0)
Platelets: 198 10*3/uL (ref 150.0–400.0)
RBC: 3.78 Mil/uL — ABNORMAL LOW (ref 3.87–5.11)
RDW: 13.2 % (ref 11.5–15.5)
WBC: 5.5 10*3/uL (ref 4.0–10.5)

## 2021-01-17 LAB — LUTEINIZING HORMONE: LH: 58.31 m[IU]/mL

## 2021-01-17 LAB — TSH: TSH: 0.71 u[IU]/mL (ref 0.35–4.50)

## 2021-01-17 LAB — LIPID PANEL
Cholesterol: 202 mg/dL — ABNORMAL HIGH (ref 0–200)
HDL: 64.5 mg/dL (ref >39.00)
LDL Cholesterol: 121 mg/dL — ABNORMAL HIGH (ref 0–99)
NonHDL: 137.54
Total CHOL/HDL Ratio: 3
Triglycerides: 81 mg/dL (ref 0.0–149.0)
VLDL: 16.2 mg/dL (ref 0.0–40.0)

## 2021-01-17 LAB — COMPREHENSIVE METABOLIC PANEL
ALT: 23 U/L (ref 0–35)
AST: 28 U/L (ref 0–37)
Albumin: 4.2 g/dL (ref 3.5–5.2)
Alkaline Phosphatase: 42 U/L (ref 39–117)
BUN: 18 mg/dL (ref 6–23)
CO2: 30 mEq/L (ref 19–32)
Calcium: 8.8 mg/dL (ref 8.4–10.5)
Chloride: 103 mEq/L (ref 96–112)
Creatinine, Ser: 0.84 mg/dL (ref 0.40–1.20)
GFR: 81.11 mL/min (ref >60.00)
Glucose, Bld: 93 mg/dL (ref 70–99)
Potassium: 3.8 mEq/L (ref 3.5–5.1)
Sodium: 139 mEq/L (ref 135–145)
Total Bilirubin: 0.5 mg/dL (ref 0.2–1.2)
Total Protein: 6.7 g/dL (ref 6.0–8.3)

## 2021-01-17 LAB — FOLLICLE STIMULATING HORMONE: FSH: 106.3 m[IU]/mL

## 2021-01-17 LAB — VITAMIN B12: Vitamin B-12: 936 pg/mL — ABNORMAL HIGH (ref 211–911)

## 2021-01-19 LAB — SARS-COV-2 SEMI-QUANTITATIVE TOTAL ANTIBODY, SPIKE: SARS COV2 AB, Total Spike Semi QN: >2500 U/mL — ABNORMAL HIGH (ref <0.8)

## 2021-04-08 ENCOUNTER — Encounter: Payer: Self-pay | Admitting: Internal Medicine

## 2021-04-19 MED ORDER — AMPHETAMINE-DEXTROAMPHETAMINE 5 MG PO TABS: 5.0000 mg | ORAL_TABLET | Freq: Every day | ORAL | 0 refills | Status: DC

## 2021-06-13 MED ORDER — SERTRALINE HCL 100 MG PO TABS: 100.0000 mg | ORAL_TABLET | Freq: Every day | ORAL | 1 refills | Status: DC

## 2021-07-04 ENCOUNTER — Other Ambulatory Visit: Payer: Self-pay

## 2021-07-04 MED ORDER — AMPHETAMINE-DEXTROAMPHETAMINE 5 MG PO TABS: 5.0000 mg | ORAL_TABLET | Freq: Every day | ORAL | 0 refills | Status: DC

## 2021-07-04 NOTE — Telephone Encounter (Signed)
RX Refill: adderall Last Seen: 01-04-21 Last Ordered: 04-19-21 Next Appt: NA

## 2021-07-06 ENCOUNTER — Other Ambulatory Visit: Payer: Self-pay | Admitting: Internal Medicine

## 2021-07-12 ENCOUNTER — Other Ambulatory Visit: Payer: Self-pay | Admitting: Internal Medicine

## 2021-07-20 ENCOUNTER — Other Ambulatory Visit: Payer: Self-pay | Admitting: Internal Medicine

## 2021-07-20 MED ORDER — METHYLPHENIDATE HCL ER (LA) 10 MG PO CP24: 10.0000 mg | ORAL_CAPSULE | Freq: Every day | ORAL | 0 refills | Status: DC

## 2021-08-25 ENCOUNTER — Other Ambulatory Visit: Payer: Self-pay | Admitting: Internal Medicine

## 2021-08-25 MED ORDER — METHYLPHENIDATE HCL ER (LA) 10 MG PO CP24
10.0000 mg | ORAL_CAPSULE | Freq: Every day | ORAL | 0 refills | Status: DC
Start: 2021-08-25 — End: 2021-10-23

## 2021-08-25 NOTE — Telephone Encounter (Signed)
Last OV 01/04/21 last refill 07/26/21

## 2021-09-27 ENCOUNTER — Other Ambulatory Visit: Payer: Self-pay

## 2021-09-27 MED ORDER — SERTRALINE HCL 100 MG PO TABS: 100.0000 mg | ORAL_TABLET | Freq: Every day | ORAL | 0 refills | Status: DC

## 2021-10-23 ENCOUNTER — Other Ambulatory Visit: Payer: Self-pay | Admitting: Internal Medicine

## 2021-10-24 ENCOUNTER — Other Ambulatory Visit: Payer: Self-pay | Admitting: Internal Medicine

## 2021-10-25 MED ORDER — METHYLPHENIDATE HCL ER (LA) 10 MG PO CP24: 10.0000 mg | ORAL_CAPSULE | Freq: Every day | ORAL | 0 refills | Status: DC

## 2021-12-15 ENCOUNTER — Other Ambulatory Visit: Payer: Self-pay | Admitting: Internal Medicine

## 2021-12-15 MED ORDER — METHYLPHENIDATE HCL ER (LA) 10 MG PO CP24: 10.0000 mg | ORAL_CAPSULE | Freq: Every day | ORAL | 0 refills | Status: DC

## 2021-12-26 ENCOUNTER — Other Ambulatory Visit: Payer: Self-pay | Admitting: Certified Nurse Midwife

## 2021-12-26 DIAGNOSIS — Z1231 Encounter for screening mammogram for malignant neoplasm of breast: Secondary | ICD-10-CM

## 2022-01-25 ENCOUNTER — Encounter: Payer: Self-pay | Admitting: Radiology

## 2022-01-25 ENCOUNTER — Ambulatory Visit
Admission: RE | Admit: 2022-01-25 | Discharge: 2022-01-25 | Disposition: A | Discharge status: Home / Self Care | Payer: PRIVATE HEALTH INSURANCE | Source: Ambulatory Visit | Attending: Certified Nurse Midwife | Admitting: Certified Nurse Midwife

## 2022-01-25 DIAGNOSIS — Z1231 Encounter for screening mammogram for malignant neoplasm of breast: Secondary | ICD-10-CM | POA: Insufficient documentation

## 2022-03-08 ENCOUNTER — Telehealth: Payer: PRIVATE HEALTH INSURANCE | Admitting: Physician Assistant

## 2022-03-08 DIAGNOSIS — R3989 Other symptoms and signs involving the genitourinary system: Secondary | ICD-10-CM

## 2022-03-08 MED ORDER — NITROFURANTOIN MONOHYD MACRO 100 MG PO CAPS: 100.0000 mg | ORAL_CAPSULE | Freq: Two times a day (BID) | ORAL | 0 refills | Status: DC

## 2022-03-08 NOTE — Patient Instructions (Signed)
Lauree Chandler, thank you for joining Mar Daring, PA-C for today's virtual visit.  While this provider is not your primary care provider (PCP), if your PCP is located in our provider database this encounter information will be shared with them immediately following your visit.  Consent: (Patient) Kristi Spence provided verbal consent for this virtual visit at the beginning of the encounter.  Current Medications:  Current Outpatient Medications:    nitrofurantoin, macrocrystal-monohydrate, (MACROBID) 100 MG capsule, Take 1 capsule (100 mg total) by mouth 2 (two) times daily., Disp: 10 capsule, Rfl: 0   gabapentin (NEURONTIN) 100 MG capsule, Take by mouth., Disp: , Rfl:    meloxicam (MOBIC) 15 MG tablet, Take 1 tablet by mouth daily., Disp: , Rfl:    methylphenidate (RITALIN LA) 10 MG 24 hr capsule, Take 1 capsule (10 mg total) by mouth daily., Disp: 30 capsule, Rfl: 0   sertraline (ZOLOFT) 100 MG tablet, Take 1 tablet (100 mg total) by mouth daily., Disp: 90 tablet, Rfl: 0   traZODone (DESYREL) 50 MG tablet, Take 0.5-1 tablets (25-50 mg total) by mouth at bedtime as needed for sleep., Disp: 30 tablet, Rfl: 3   valACYclovir (VALTREX) 500 MG tablet, TAKE 1 TABLET(500 MG) BY MOUTH DAILY, Disp: 90 tablet, Rfl: 1   Medications ordered in this encounter:  Meds ordered this encounter  Medications   nitrofurantoin, macrocrystal-monohydrate, (MACROBID) 100 MG capsule    Sig: Take 1 capsule (100 mg total) by mouth 2 (two) times daily.    Dispense:  10 capsule    Refill:  0    Order Specific Question:   Supervising Provider    Answer:   Sabra Heck, [place]     *If you need refills on other medications prior to your next appointment, please contact your pharmacy*  Follow-Up: Call back or seek an in-person evaluation if the symptoms worsen or if the condition fails to improve as anticipated.  Other Instructions Urinary Tract Infection, Adult  A urinary tract  infection (UTI) is an infection of any part of the urinary tract. The urinary tract includes the kidneys, ureters, bladder, and urethra. These organs make, store, and get rid of urine in the body. An upper UTI affects the ureters and kidneys. A lower UTI affects the bladder and urethra. What are the causes? Most urinary tract infections are caused by bacteria in your genital area around your urethra, where urine leaves your body. These bacteria grow and cause inflammation of your urinary tract. What increases the risk? You are more likely to develop this condition if: You have a urinary catheter that stays in place. You are not able to control when you urinate or have a bowel movement (incontinence). You are female and you: Use a spermicide or diaphragm for birth control. Have low estrogen levels. Are pregnant. You have certain genes that increase your risk. You are sexually active. You take antibiotic medicines. You have a condition that causes your flow of urine to slow down, such as: An enlarged prostate, if you are female. Blockage in your urethra. A kidney stone. A nerve condition that affects your bladder control (neurogenic bladder). Not getting enough to drink, or not urinating often. You have certain medical conditions, such as: Diabetes. A weak disease-fighting system (immunesystem). Sickle cell disease. Gout. Spinal cord injury. What are the signs or symptoms? Symptoms of this condition include: Needing to urinate right away (urgency). Frequent urination. This may include small amounts of urine each time you urinate. Pain or  burning with urination. Blood in the urine. Urine that smells bad or unusual. Trouble urinating. Cloudy urine. Vaginal discharge, if you are female. Pain in the abdomen or the lower back. You may also have: Vomiting or a decreased appetite. Confusion. Irritability or tiredness. A fever or chills. Diarrhea. The first symptom in older adults may  be confusion. In some cases, they may not have any symptoms until the infection has worsened. How is this diagnosed? This condition is diagnosed based on your medical history and a physical exam. You may also have other tests, including: Urine tests. Blood tests. Tests for STIs (sexually transmitted infections). If you have had more than one UTI, a cystoscopy or imaging studies may be done to determine the cause of the infections. How is this treated? Treatment for this condition includes: Antibiotic medicine. Over-the-counter medicines to treat discomfort. Drinking enough water to stay hydrated. If you have frequent infections or have other conditions such as a kidney stone, you may need to see a health care provider who specializes in the urinary tract (urologist). In rare cases, urinary tract infections can cause sepsis. Sepsis is a life-threatening condition that occurs when the body responds to an infection. Sepsis is treated in the hospital with IV antibiotics, fluids, and other medicines. Follow these instructions at home:  Medicines Take over-the-counter and prescription medicines only as told by your health care provider. If you were prescribed an antibiotic medicine, take it as told by your health care provider. Do not stop using the antibiotic even if you start to feel better. General instructions Make sure you: Empty your bladder often and completely. Do not hold urine for long periods of time. Empty your bladder after sex. Wipe from front to back after urinating or having a bowel movement if you are female. Use each tissue only one time when you wipe. Drink enough fluid to keep your urine pale yellow. Keep all follow-up visits. This is important. Contact a health care provider if: Your symptoms do not get better after 1-2 days. Your symptoms go away and then return. Get help right away if: You have severe pain in your back or your lower abdomen. You have a fever or  chills. You have nausea or vomiting. Summary A urinary tract infection (UTI) is an infection of any part of the urinary tract, which includes the kidneys, ureters, bladder, and urethra. Most urinary tract infections are caused by bacteria in your genital area. Treatment for this condition often includes antibiotic medicines. If you were prescribed an antibiotic medicine, take it as told by your health care provider. Do not stop using the antibiotic even if you start to feel better. Keep all follow-up visits. This is important. This information is not intended to replace advice given to you by your health care provider. Make sure you discuss any questions you have with your health care provider. Document Revised: 03/26/2020 Document Reviewed: 03/26/2020 Elsevier Patient Education  [place].    If you have been instructed to have an in-person evaluation today at a local Urgent Care facility, please use the link below. It will take you to a list of all of our available [facility], including address, phone number and hours of operation. Please do not delay care.  [facility]  If you or a family member do not have a primary care provider, use the link below to schedule a visit and establish care. When you choose a  primary care physician or advanced  practice provider, you gain a long-term partner in health. Find a Primary Care Provider  Learn more about [facility]'s in-office and virtual care options: [facility]

## 2022-03-08 NOTE — Progress Notes (Signed)
Virtual Visit Consent   Kristi Spence, you are scheduled for a virtual visit with a [facility] provider today. Just as with appointments in the office, your consent must be obtained to participate. Your consent will be active for this visit and any virtual visit you may have with one of our providers in the next 365 days. If you have a MyChart account, a copy of this consent can be sent to you electronically.  As this is a virtual visit, video technology does not allow for your provider to perform a traditional examination. This may limit your provider's ability to fully assess your condition. If your provider identifies any concerns that need to be evaluated in person or the need to arrange testing (such as labs, EKG, etc.), we will make arrangements to do so. Although advances in technology are sophisticated, we cannot ensure that it will always work on either your end or our end. If the connection with a video visit is poor, the visit may have to be switched to a telephone visit. With either a video or telephone visit, we are not always able to ensure that we have a secure connection.  By engaging in this virtual visit, you consent to the provision of healthcare and authorize for your insurance to be billed (if applicable) for the services provided during this visit. Depending on your insurance coverage, you may receive a charge related to this service.  I need to obtain your verbal consent now. Are you willing to proceed with your visit today? Kristi Spence has provided verbal consent on 03/08/2022 for a virtual visit (video or telephone). Mar Daring, PA-C  Date: 03/08/2022 10:51 AM  Virtual Visit via Video Note   I, Mar Daring, connected with  Kristi Spence  ([id number], 02-11-1971) on 03/08/22 at 10:45 AM EDT by a video-enabled telemedicine application and verified that I am speaking with the correct person using two identifiers.  Location: Patient:  Virtual Visit Location Patient: Mobile Provider: Virtual Visit Location Provider: Home Office   I discussed the limitations of evaluation and management by telemedicine and the availability of in person appointments. The patient expressed understanding and agreed to proceed.    History of Present Illness: Kristi Spence is a 51 y.o. who identifies as a female who was assigned female at birth, and is being seen today for suspected uti.  HPI: Urinary Tract Infection  This is a new problem. The current episode started in the past 7 days. The problem occurs every urination. The problem has been gradually worsening. The quality of the pain is described as aching and burning. The pain is mild. There has been no fever. Associated symptoms include frequency, hesitancy and urgency. Pertinent negatives include no chills, flank pain, hematuria, nausea, sweats or vomiting. She has tried increased fluids and NSAIDs for the symptoms. The treatment provided no relief. There is no history of catheterization, recurrent UTIs, urinary stasis or a urological procedure.      Problems:  Patient Active Problem List   Diagnosis Date Noted   Family history of colonic polyps 01/06/2021   Insomnia 01/06/2021   Cervical high risk HPV (human papillomavirus) test positive 03/26/2020   Implanon in place 12/31/2019   Allergic reaction to chemical substance 12/29/2018   Carpal tunnel syndrome, bilateral 09/27/2017   Anxiety as acute reaction to exceptional stress 03/27/2017   Spouse of alcoholic 92/42/6834   Chronic right-sided low back pain 12/17/2016   Degenerative joint disease (DJD) of hip  12/17/2016   Allergy to sulfa drugs 09/14/2016   Encounter for preventive health examination 02/15/2016   Menstrual irregularity 02/15/2016   Fibroadenoma of left breast 05/19/2015   Attention deficit hyperactivity disorder (ADHD), predominantly inattentive type 04/03/2014   Loss of libido 04/03/2014   Raynaud  phenomenon 07/26/2011    Allergies:  Allergies  Allergen Reactions   Adhesive [Tape] Dermatitis   Doxepin Other (See Comments)    Over sedation.   Penicillins Hives   Sulfa Antibiotics Hives   Sulfasalazine Hives   Other Rash    Steri strips   Medications:  Current Outpatient Medications:    nitrofurantoin, macrocrystal-monohydrate, (MACROBID) 100 MG capsule, Take 1 capsule (100 mg total) by mouth 2 (two) times daily., Disp: 10 capsule, Rfl: 0   gabapentin (NEURONTIN) 100 MG capsule, Take by mouth., Disp: , Rfl:    meloxicam (MOBIC) 15 MG tablet, Take 1 tablet by mouth daily., Disp: , Rfl:    methylphenidate (RITALIN LA) 10 MG 24 hr capsule, Take 1 capsule (10 mg total) by mouth daily., Disp: 30 capsule, Rfl: 0   sertraline (ZOLOFT) 100 MG tablet, Take 1 tablet (100 mg total) by mouth daily., Disp: 90 tablet, Rfl: 0   traZODone (DESYREL) 50 MG tablet, Take 0.5-1 tablets (25-50 mg total) by mouth at bedtime as needed for sleep., Disp: 30 tablet, Rfl: 3   valACYclovir (VALTREX) 500 MG tablet, TAKE 1 TABLET(500 MG) BY MOUTH DAILY, Disp: 90 tablet, Rfl: 1  Observations/Objective: Patient is well-developed, well-nourished in no acute distress.  Resting comfortably  Head is normocephalic, atraumatic.  No labored breathing.  Speech is clear and coherent with logical content.  Patient is alert and oriented at baseline.    Assessment and Plan: 1. Suspected UTI - nitrofurantoin, macrocrystal-monohydrate, (MACROBID) 100 MG capsule; Take 1 capsule (100 mg total) by mouth 2 (two) times daily.  Dispense: 10 capsule; Refill: 0  - Worsening symptoms.  - Will treat empirically with Macrobid - May use AZO for bladder spasms - Continue to push fluids.  - Seek in person evaluation for urine culture if symptoms do not improve or if they worsen.    Follow Up Instructions: I discussed the assessment and treatment plan with the patient. The patient was provided an opportunity to ask questions  and all were answered. The patient agreed with the plan and demonstrated an understanding of the instructions.  A copy of instructions were sent to the patient via MyChart unless otherwise noted below.   The patient was advised to call back or seek an in-person evaluation if the symptoms worsen or if the condition fails to improve as anticipated.  Time:  I spent 10 minutes with the patient via telehealth technology discussing the above problems/concerns.    Mar Daring, PA-C

## 2022-03-19 ENCOUNTER — Encounter: Payer: Self-pay | Admitting: Physician Assistant

## 2022-04-06 ENCOUNTER — Encounter: Payer: Self-pay | Admitting: Physician Assistant

## 2022-04-10 ENCOUNTER — Other Ambulatory Visit: Payer: Self-pay | Admitting: Internal Medicine

## 2022-04-10 NOTE — Telephone Encounter (Signed)
Refilled: 12/15/2021 Last OV: 01/04/2021 Next OV: not scheduled

## 2022-04-11 ENCOUNTER — Other Ambulatory Visit: Payer: Self-pay

## 2022-04-11 ENCOUNTER — Encounter: Payer: Self-pay | Admitting: Internal Medicine

## 2022-04-11 NOTE — Telephone Encounter (Signed)
Spoke with pt and scheduled her for an appt on Friday at 10:30 for a medication refill appt.

## 2022-04-14 ENCOUNTER — Ambulatory Visit (INDEPENDENT_AMBULATORY_CARE_PROVIDER_SITE_OTHER): Payer: PRIVATE HEALTH INSURANCE | Admitting: Internal Medicine

## 2022-04-14 ENCOUNTER — Encounter: Payer: Self-pay | Admitting: Internal Medicine

## 2022-04-14 VITALS — BP 120/72 | HR 104 | Temp 98.8°F | Ht 64.0 in | Wt 125.4 lb

## 2022-04-14 DIAGNOSIS — G5702 Lesion of sciatic nerve, left lower limb: Secondary | ICD-10-CM | POA: Diagnosis not present

## 2022-04-14 DIAGNOSIS — F900 Attention-deficit hyperactivity disorder, predominantly inattentive type: Secondary | ICD-10-CM

## 2022-04-14 DIAGNOSIS — Z23 Encounter for immunization: Secondary | ICD-10-CM | POA: Diagnosis not present

## 2022-04-14 DIAGNOSIS — R102 Pelvic and perineal pain: Secondary | ICD-10-CM

## 2022-04-14 DIAGNOSIS — Z882 Allergy status to sulfonamides status: Secondary | ICD-10-CM

## 2022-04-14 DIAGNOSIS — Z6372 Alcoholism and drug addiction in family: Secondary | ICD-10-CM | POA: Diagnosis not present

## 2022-04-14 LAB — URINALYSIS, ROUTINE W REFLEX MICROSCOPIC
Bilirubin Urine: NEGATIVE
Hgb urine dipstick: NEGATIVE
Ketones, ur: NEGATIVE
Leukocytes,Ua: NEGATIVE
Nitrite: NEGATIVE
RBC / HPF: NONE SEEN (ref 0–?)
Specific Gravity, Urine: <=1.005 — AB (ref 1.000–1.030)
Total Protein, Urine: NEGATIVE
Urine Glucose: NEGATIVE
Urobilinogen, UA: 0.2 (ref 0.0–1.0)
WBC, UA: NONE SEEN (ref 0–?)
pH: 7 (ref 5.0–8.0)

## 2022-04-14 MED ORDER — VALACYCLOVIR HCL 500 MG PO TABS: ORAL_TABLET | ORAL | 1 refills | Status: AC

## 2022-04-14 MED ORDER — CELECOXIB 200 MG PO CAPS: 200.0000 mg | ORAL_CAPSULE | Freq: Two times a day (BID) | ORAL | 1 refills | Status: DC

## 2022-04-14 MED ORDER — TIZANIDINE HCL 4 MG PO TABS: 4.0000 mg | ORAL_TABLET | Freq: Four times a day (QID) | ORAL | 0 refills | Status: DC | PRN

## 2022-04-14 MED ORDER — ATOMOXETINE HCL 10 MG PO CAPS: 10.0000 mg | ORAL_CAPSULE | Freq: Every day | ORAL | 1 refills | Status: DC

## 2022-04-14 NOTE — Assessment & Plan Note (Signed)
Tolerates wine without hives,  Advised to stop celebrex if pruritis or hives develop

## 2022-04-14 NOTE — Assessment & Plan Note (Signed)
Previously managed with methylphenidate.  Trial of Straterra requested ; starting dose 10 mg

## 2022-04-14 NOTE — Assessment & Plan Note (Signed)
Rechecking urine.  Last week's urinalysis was reportedly negative for infection ([facility]) per patient ,  But symptoms persist.  ddx includes interstitial cystitis, atrophic vaginitis, and bladder stone.  ua and culture ordered

## 2022-04-14 NOTE — Patient Instructions (Signed)
Your bladder symptoms may be due to interstitial cystitis or a bladder stone  If the UA suggests either,  I'll be ordering a CT scan next week   Tizanidine and celebrex for pyriformis syndrome   Straterra trial.  One daily,  increase to 2 daily if needed after 3 or 4 days

## 2022-04-14 NOTE — Assessment & Plan Note (Signed)
Ex husband.  Now estranged from [place] , her significant other for the past year, due to similar issues

## 2022-04-14 NOTE — Assessment & Plan Note (Signed)
Self diagnosed.  Managing with stretching and glut exercises.  Adding celebrex and tizanidine

## 2022-04-14 NOTE — Progress Notes (Unsigned)
Subjective:  Patient ID: Kristi Spence, female    DOB: 12-24-1970  Age: 51 y.o. MRN: [id number]  CC: The primary encounter diagnosis was Need for tetanus booster. Diagnoses of Suprapubic pressure, Allergy to sulfa drugs, Pyriformis syndrome, left, Spouse of alcoholic, and Attention deficit hyperactivity disorder (ADHD), predominantly inattentive type were also pertinent to this visit.   HPI Kristi Spence presents for medication refill and follow up on chronic issues  Chief Complaint  Patient presents with   Follow-up    Follow up for medication refills   1) ADD:  she is functioning well on methylphenidate,  but is requesting a trial of Straterra as an alternative to stimulant  2) Left sided hip and buttock pain :  self diagnosed as pyriformis syndrome.  Symptoms aggravated by prolonged sitting and improved with stretching , exercising gluts  Outpatient Medications Prior to Visit  Medication Sig Dispense Refill   methylphenidate (RITALIN LA) 10 MG 24 hr capsule Take 1 capsule (10 mg total) by mouth daily. 30 capsule 0   valACYclovir (VALTREX) 500 MG tablet TAKE 1 TABLET(500 MG) BY MOUTH DAILY 90 tablet 1   gabapentin (NEURONTIN) 100 MG capsule Take by mouth. (Patient not taking: Reported on 04/14/2022)     meloxicam (MOBIC) 15 MG tablet Take 1 tablet by mouth daily. (Patient not taking: Reported on 04/14/2022)     nitrofurantoin, macrocrystal-monohydrate, (MACROBID) 100 MG capsule Take 1 capsule (100 mg total) by mouth 2 (two) times daily. (Patient not taking: Reported on 04/14/2022) 10 capsule 0   sertraline (ZOLOFT) 100 MG tablet Take 1 tablet (100 mg total) by mouth daily. 90 tablet 0   traZODone (DESYREL) 50 MG tablet Take 0.5-1 tablets (25-50 mg total) by mouth at bedtime as needed for sleep. (Patient not taking: Reported on 04/14/2022) 30 tablet 3   No facility-administered medications prior to visit.    Review of Systems;  Patient denies headache, fevers, malaise,  unintentional weight loss, skin rash, eye pain, sinus congestion and sinus pain, sore throat, dysphagia,  hemoptysis , cough, dyspnea, wheezing, chest pain, palpitations, orthopnea, edema, abdominal pain, nausea, melena, diarrhea, constipation, flank pain, dysuria, hematuria, urinary  Frequency, nocturia, numbness, tingling, seizures,  Focal weakness, Loss of consciousness,  Tremor, insomnia, depression, anxiety, and suicidal ideation.      Objective:  BP 120/72 (BP Location: Left Arm, Patient Position: Sitting, Cuff Size: Normal)   Pulse (!) 104   Temp 98.8 F (37.1 C) (Oral)   Ht '5\' 4"'$  (1.626 m)   Wt 125 lb 6.4 oz (56.9 kg)   LMP 03/03/2020   SpO2 97%   BMI 21.52 kg/m   BP Readings from Last 3 Encounters:  04/14/22 120/72  01/04/21 100/72  12/31/19 110/60    Wt Readings from Last 3 Encounters:  04/14/22 125 lb 6.4 oz (56.9 kg)  01/04/21 126 lb (57.2 kg)  06/14/20 120 lb (54.4 kg)    General appearance: alert, cooperative and appears stated age Ears: normal TM's and external ear canals both ears Throat: lips, mucosa, and tongue normal; teeth and gums normal Neck: no adenopathy, no carotid bruit, supple, symmetrical, trachea midline and thyroid not enlarged, symmetric, no tenderness/mass/nodules Back: symmetric, no curvature. ROM normal. No CVA tenderness. Lungs: clear to auscultation bilaterally Heart: regular rate and rhythm, S1, S2 normal, no murmur, click, rub or gallop Abdomen: soft, non-tender; bowel sounds normal; no masses,  no organomegaly Pulses: 2+ and symmetric Skin: Skin color, texture, turgor normal. No rashes or lesions Lymph nodes:  Cervical, supraclavicular, and axillary nodes normal.  No results found for: "HGBA1C"  Lab Results  Component Value Date   CREATININE 0.84 01/17/2021   CREATININE 0.70 01/02/2020   CREATININE 0.67 02/14/2016    Lab Results  Component Value Date   WBC 5.5 01/17/2021   HGB 13.3 01/17/2021   HCT 39.7 01/17/2021   PLT 198.0  01/17/2021   GLUCOSE 93 01/17/2021   CHOL 202 (H) 01/17/2021   TRIG 81.0 01/17/2021   HDL 64.50 01/17/2021   LDLCALC 121 (H) 01/17/2021   ALT 23 01/17/2021   AST 28 01/17/2021   NA 139 01/17/2021   K 3.8 01/17/2021   CL 103 01/17/2021   CREATININE 0.84 01/17/2021   BUN 18 01/17/2021   CO2 30 01/17/2021   TSH 0.71 01/17/2021    MM 3D SCREEN BREAST W/IMPLANT BILATERAL  Result Date: 01/26/2022 CLINICAL DATA:  Screening. EXAM: DIGITAL SCREENING BILATERAL MAMMOGRAM WITH IMPLANTS, CAD AND TOMOSYNTHESIS TECHNIQUE: Bilateral screening digital craniocaudal and mediolateral oblique mammograms were obtained. Bilateral screening digital breast tomosynthesis was performed. The images were evaluated with computer-aided detection. Standard and/or implant displaced views were performed. COMPARISON:  Previous exam(s). ACR Breast Density Category c: The breast tissue is heterogeneously dense, which may obscure small masses. FINDINGS: The patient has retropectoral implants. There are no findings suspicious for malignancy. IMPRESSION: No mammographic evidence of malignancy. A result letter of this screening mammogram will be mailed directly to the patient. RECOMMENDATION: Screening mammogram in one year. (Code:SM-B-01Y) BI-RADS CATEGORY  1:  Negative. Electronically Signed   By: Ammie Ferrier M.D.   On: 01/26/2022 11:24    Assessment & Plan:   Problem List Items Addressed This Visit     Allergy to sulfa drugs    Tolerates wine without hives,  Advised to stop celebrex if pruritis or hives develop      Attention deficit hyperactivity disorder (ADHD), predominantly inattentive type    Previously managed with methylphenidate.  Trial of Straterra requested ; starting dose 10 mg       Pyriformis syndrome, left    Self diagnosed.  Managing with stretching and glut exercises.  Adding celebrex and tizanidine      Relevant Medications   tiZANidine (ZANAFLEX) 4 MG tablet   atomoxetine (STRATTERA) 10 MG  capsule   Spouse of alcoholic    Ex husband.  Now estranged from [place] , her significant other for the past year, due to similar issues      Suprapubic pressure    Last week's urinalysis was reportedly negative for infection ([facility] Med) per patient ,  But symptoms persist.  ddx includes interstitial cystitis, atrophic vaginitis, and bladder stone.  ua and culture were negative today ,  And without hematuria ,  Joaquim Lai is unlikely will obtain bladder ultrasound       Relevant Orders   Urinalysis, Routine w reflex microscopic (Completed)   Urine Culture (Completed)   [place], retroperitnl abd,  ltd   Other Visit Diagnoses     Need for tetanus booster    -  Primary   Relevant Orders   Td : Tetanus/diphtheria >7yo Preservative  free (Completed)       I spent a total of  38 minutes with this patient in a face to face visit on the date of this encounter reviewing the last office visit with me in 2022,   most recent with patient's rheumatologist in June  ,  counselling on current management of ADD and pyriformis syndrome  and post  visit ordering of testing and therapeutics.    Follow-up: No follow-ups on file.   Crecencio Mc, MD

## 2022-04-16 LAB — URINE CULTURE
MICRO NUMBER:: 13800092
SPECIMEN QUALITY:: ADEQUATE

## 2022-04-20 ENCOUNTER — Telehealth: Payer: Self-pay

## 2022-04-20 NOTE — Telephone Encounter (Signed)
PA for celebrex has been submitted on covermymeds.

## 2022-08-08 NOTE — Telephone Encounter (Signed)
MyChart messgae sent to patient. 

## 2022-08-08 NOTE — Telephone Encounter (Signed)
This encounter was created in error - please disregard.

## 2022-08-08 NOTE — Telephone Encounter (Signed)
Error

## 2022-10-31 ENCOUNTER — Other Ambulatory Visit: Payer: Self-pay | Admitting: Internal Medicine

## 2022-10-31 DIAGNOSIS — Z1231 Encounter for screening mammogram for malignant neoplasm of breast: Secondary | ICD-10-CM

## 2022-12-25 ENCOUNTER — Encounter: Payer: Self-pay | Admitting: Internal Medicine

## 2022-12-25 ENCOUNTER — Encounter: Payer: Self-pay | Admitting: Physician Assistant

## 2022-12-26 ENCOUNTER — Encounter: Payer: Self-pay | Admitting: Nurse Practitioner

## 2022-12-26 ENCOUNTER — Ambulatory Visit (INDEPENDENT_AMBULATORY_CARE_PROVIDER_SITE_OTHER): Payer: No Typology Code available for payment source | Admitting: Nurse Practitioner

## 2022-12-26 VITALS — BP 116/80 | HR 82 | Temp 98.3°F | Ht 64.0 in | Wt 123.0 lb

## 2022-12-26 DIAGNOSIS — L309 Dermatitis, unspecified: Secondary | ICD-10-CM

## 2022-12-26 MED ORDER — METHYLPREDNISOLONE 4 MG PO TBPK: ORAL_TABLET | ORAL | 0 refills | Status: DC

## 2022-12-26 MED ORDER — CLOTRIMAZOLE-BETAMETHASONE 1-0.05 % EX CREA: 1.0000 | TOPICAL_CREAM | Freq: Every day | CUTANEOUS | 0 refills | Status: AC

## 2022-12-26 NOTE — Progress Notes (Signed)
Established Patient Office Visit  Subjective:  Patient ID: Kristi Spence, female    DOB: 1970/12/26  Age: 52 y.o. MRN: [id number]  CC:  Chief Complaint  Patient presents with   Acute Visit   Poison [place]    11/2622 Patient got poison ivy from gardening. Poison ivy is on her ears, neck, arms, back & buttocks and just keeps spreading.  Patient is using Calamine Lotion    HPI  Kristi Spence presents for an allergic reaction after working in the yard 5 days ago.  She reports that she got poison ivy while she was in the yard.  She have  rash on left ear, right hand and right bottom and middle of the back.  She has been using calamine lotion without significant improvement.  She reports the rashes are pruritic.  HPI   Past Medical History:  Diagnosis Date   ADD (attention deficit disorder)    Cervical disc disorder with radiculopathy of cervical region    Depression    Heart palpitations    Raynaud's disease 2013   Raynaud's syndrome without gangrene     Past Surgical History:  Procedure Laterality Date   AUGMENTATION MAMMAPLASTY     BREAST BIOPSY Left 11-11-14   FIBROADENOMA core   CESAREAN SECTION  02/2003, 11/2005   COLONOSCOPY WITH PROPOFOL N/A 12/25/2016   Procedure: COLONOSCOPY WITH PROPOFOL;  Surgeon: Christena Deem, MD;  Location[facility] END[facility]COPY;  Service: Endoscopy;  Laterality: N/A;   ESOPHAGOGASTRODUODENOSCOPY (EGD) WITH PROPOFOL N/A 12/25/2016   Procedure: ESOPHAGOGASTRODUODENOSCOPY (EGD) WITH PROPOFOL;  Surgeon: Christena Deem, MD;  Location: [facility] ENDOSCOPY;  Service: Endoscopy;  Laterality: N/A;    Family History  Problem Relation Age of Onset   Heart disease Father    Hypertension Father    Cancer Mother        cervical    Cancer Maternal Grandfather        leukemia   Stroke Paternal Grandmother    Stroke Paternal Grandfather    Breast cancer Neg Hx     Social History   Socioeconomic History   Marital status: Divorced    Spouse  name: Not on file   Number of children: Not on file   Years of education: Not on file   Highest education level: Not on file  Occupational History   Not on file  Tobacco Use   Smoking status: Never   Smokeless tobacco: Never  Vaping Use   Vaping Use: Never used  Substance and Sexual Activity   Alcohol use: Yes    Alcohol/week: 5.0 standard drinks of alcohol    Types: 5 Standard drinks or equivalent per week   Drug use: No   Sexual activity: Yes    Birth control/protection: Condom  Other Topics Concern   Not on file  Social History Narrative   Not on file   Social Determinants of Health   Financial Resource Strain: Not on file  Food Insecurity: Not on file  Transportation Needs: Not on file  Physical Activity: Not on file  Stress: Not on file  Social Connections: Not on file  Intimate Partner Violence: Not on file     Outpatient Medications Prior to Visit  Medication Sig Dispense Refill   celecoxib (CELEBREX) 200 MG capsule Take 1 capsule (200 mg total) by mouth 2 (two) times daily. 60 capsule 1   valACYclovir (VALTREX) 500 MG tablet TAKE 1 TABLET(500 MG) BY MOUTH DAILY 90 tablet 1   tiZANidine (ZANAFLEX)  4 MG tablet Take 1 tablet (4 mg total) by mouth every 6 (six) hours as needed for muscle spasms. 30 tablet 0   atomoxetine (STRATTERA) 10 MG capsule Take 1 capsule (10 mg total) by mouth daily. (Patient not taking: Reported on 12/26/2022) 90 capsule 1   No facility-administered medications prior to visit.    Allergies  Allergen Reactions   Adhesive [Tape] Dermatitis   Doxepin Other (See Comments)    Over sedation.   Penicillins Hives   Sulfa Antibiotics Hives   Sulfasalazine Hives   Other Rash    Steri strips    ROS Review of Systems  Constitutional: Negative.   HENT: Negative.    Respiratory:  Negative for cough.   Cardiovascular: Negative.   Gastrointestinal: Negative.   Skin:  Positive for rash.  Neurological: Negative.   Psychiatric/Behavioral:  Negative.        Objective:    Physical Exam Constitutional:      Appearance: She is normal weight.  HENT:     Head: Normocephalic.     Mouth/Throat:     Mouth: Mucous membranes are moist.  Cardiovascular:     Rate and Rhythm: Normal rate and regular rhythm.     Pulses: Normal pulses.     Heart sounds: Normal heart sounds. No murmur heard. Pulmonary:     Effort: Pulmonary effort is normal.     Breath sounds: Normal breath sounds. No stridor. No wheezing.  Musculoskeletal:     Cervical back: Normal range of motion.  Skin:    General: Skin is warm.     Capillary Refill: Capillary refill takes less than 2 seconds.     Findings: Erythema and rash present. Rash is pustular and vesicular.       Neurological:     Mental Status: She is alert.     BP 116/80   Pulse 82   Temp 98.3 F (36.8 C)   Ht 5\' 4"  (1.626 m)   Wt 123 lb (55.8 kg)   LMP 03/03/2020   SpO2 98%   BMI 21.11 kg/m  Wt Readings from Last 3 Encounters:  12/26/22 123 lb (55.8 kg)  04/14/22 125 lb 6.4 oz (56.9 kg)  01/04/21 126 lb (57.2 kg)     Health Maintenance  Topic Date Due   HIV Screening  Never done   Hepatitis C Screening  Never done   Zoster Vaccines- Shingrix (1 of 2) Never done   COLONOSCOPY (Pts 45-4yrs Insurance coverage will need to be confirmed)  12/25/2021   PAP SMEAR-Modifier  02/03/2023   COVID-19 Vaccine (4 - 2023-24 season) 01/11/2023 (Originally 04/28/2022)   INFLUENZA VACCINE  03/29/2023   MAMMOGRAM  01/26/2024   DTaP/Tdap/Td (3 - Td or Tdap) 04/14/2032   HPV VACCINES  Aged Out    There are no preventive care reminders to display for this patient.  Lab Results  Component Value Date   TSH 0.71 01/17/2021   Lab Results  Component Value Date   WBC 5.5 01/17/2021   HGB 13.3 01/17/2021   HCT 39.7 01/17/2021   MCV 105.2 (H) 01/17/2021   PLT 198.0 01/17/2021   Lab Results  Component Value Date   NA 139 01/17/2021   K 3.8 01/17/2021   CO2 30 01/17/2021   GLUCOSE 93  01/17/2021   BUN 18 01/17/2021   CREATININE 0.84 01/17/2021   BILITOT 0.5 01/17/2021   ALKPHOS 42 01/17/2021   AST 28 01/17/2021   ALT 23 01/17/2021   PROT 6.7 01/17/2021  ALBUMIN 4.2 01/17/2021   CALCIUM 8.8 01/17/2021   GFR 81.11 01/17/2021   Lab Results  Component Value Date   CHOL 202 (H) 01/17/2021   Lab Results  Component Value Date   HDL 64.50 01/17/2021   Lab Results  Component Value Date   LDLCALC 121 (H) 01/17/2021   Lab Results  Component Value Date   TRIG 81.0 01/17/2021   Lab Results  Component Value Date   CHOLHDL 3 01/17/2021   No results found for: "HGBA1C"    Assessment & Plan:  Dermatitis Assessment & Plan: Started on Medrol Dosepak and Lotrisone cream. Advised patient to call the office with the symptoms not improving.   Other orders -     methylPREDNISolone; As stated on the pack  Dispense: 21 each; Refill: 0 -     Clotrimazole-Betamethasone; Apply 1 Application topically daily.  Dispense: 30 g; Refill: 0    Follow-up: No follow-ups on file.   Kara Dies, NP

## 2022-12-26 NOTE — Telephone Encounter (Signed)
Pt has been scheduled this afternoon at 3:20

## 2023-01-01 ENCOUNTER — Encounter: Payer: Self-pay | Admitting: Nurse Practitioner

## 2023-01-01 NOTE — Telephone Encounter (Signed)
Pt was seen on 4/30 with no office visit note please advise.

## 2023-01-01 NOTE — Assessment & Plan Note (Signed)
Started on Medrol Dosepak and Lotrisone cream. Advised patient to call the office with the symptoms not improving.

## 2023-01-02 ENCOUNTER — Ambulatory Visit (INDEPENDENT_AMBULATORY_CARE_PROVIDER_SITE_OTHER): Payer: No Typology Code available for payment source | Admitting: Nurse Practitioner

## 2023-01-02 ENCOUNTER — Encounter: Payer: Self-pay | Admitting: Nurse Practitioner

## 2023-01-02 VITALS — BP 100/72 | HR 76 | Temp 97.8°F | Ht 64.0 in | Wt 122.2 lb

## 2023-01-02 DIAGNOSIS — L309 Dermatitis, unspecified: Secondary | ICD-10-CM

## 2023-01-02 MED ORDER — CETIRIZINE HCL 10 MG PO TABS: 10.0000 mg | ORAL_TABLET | Freq: Every day | ORAL | 1 refills | Status: AC

## 2023-01-02 MED ORDER — PREDNISONE 10 MG PO TABS: ORAL_TABLET | ORAL | 0 refills | Status: AC

## 2023-01-02 NOTE — Progress Notes (Signed)
Established Patient Office Visit  Subjective:  Patient ID: Kristi Spence, female    DOB: 06/22/1971  Age: 52 y.o. MRN: [id number]  CC:  Chief Complaint  Patient presents with   Poison Ivy    1 week follow up. Poison ivy is not getting any better. It has started spreading not the arms, legs and on neck.    HPI  Kristi Spence presents for follow up on allergic reaction after working in the yard. Her old rashes are drying up but the new once are emerginmg.  The spots are very itchy. The new spot on the left side of the face and neck, on the arm and leg.  She has completed the dose of prednisone.  She is using some cream on the rashes.  HPI   Past Medical History:  Diagnosis Date   ADD (attention deficit disorder)    Cervical disc disorder with radiculopathy of cervical region    Depression    Heart palpitations    Raynaud's disease 2013   Raynaud's syndrome without gangrene     Past Surgical History:  Procedure Laterality Date   AUGMENTATION MAMMAPLASTY     BREAST BIOPSY Left 11-11-14   FIBROADENOMA core   CESAREAN SECTION  02/2003, 11/2005   COLONOSCOPY WITH PROPOFOL N/A 12/25/2016   Procedure: COLONOSCOPY WITH PROPOFOL;  Surgeon: Christena Deem, MD;  Location: [facility] ENDOSCOPY;  Service: Endoscopy;  Laterality: N/A;   ESOPHAGOGASTRODUODENOSCOPY (EGD) WITH PROPOFOL N/A 12/25/2016   Procedure: ESOPHAGOGASTRODUODENOSCOPY (EGD) WITH PROPOFOL;  Surgeon: Christena Deem, MD;  Location: [facility] ENDOSCOPY;  Service: Endoscopy;  Laterality: N/A;    Family History  Problem Relation Age of Onset   Heart disease Father    Hypertension Father    Cancer Mother        cervical    Cancer Maternal Grandfather        leukemia   Stroke Paternal Grandmother    Stroke Paternal Grandfather    Breast cancer Neg Hx     Social History   Socioeconomic History   Marital status: Divorced    Spouse name: Not on file   Number of children: Not on file   Years of  education: Not on file   Highest education level: Not on file  Occupational History   Not on file  Tobacco Use   Smoking status: Never   Smokeless tobacco: Never  Vaping Use   Vaping Use: Never used  Substance and Sexual Activity   Alcohol use: Yes    Alcohol/week: 5.0 standard drinks of alcohol    Types: 5 Standard drinks or equivalent per week   Drug use: No   Sexual activity: Yes    Birth control/protection: Condom  Other Topics Concern   Not on file  Social History Narrative   Not on file   Social Determinants of Health   Financial Resource Strain: Not on file  Food Insecurity: Not on file  Transportation Needs: Not on file  Physical Activity: Not on file  Stress: Not on file  Social Connections: Not on file  Intimate Partner Violence: Not on file     Outpatient Medications Prior to Visit  Medication Sig Dispense Refill   clotrimazole-betamethasone (LOTRISONE) cream Apply 1 Application topically daily. 30 g 0   valACYclovir (VALTREX) 500 MG tablet TAKE 1 TABLET(500 MG) BY MOUTH DAILY 90 tablet 1   celecoxib (CELEBREX) 200 MG capsule Take 1 capsule (200 mg total) by mouth 2 (two) times daily. (Patient not  taking: Reported on 01/02/2023) 60 capsule 1   methylPREDNISolone (MEDROL DOSEPAK) 4 MG TBPK tablet As stated on the pack (Patient not taking: Reported on 01/02/2023) 21 each 0   No facility-administered medications prior to visit.    Allergies  Allergen Reactions   Adhesive [Tape] Dermatitis   Doxepin Other (See Comments)    Over sedation.   Penicillins Hives   Sulfa Antibiotics Hives   Sulfasalazine Hives   Other Rash    Steri strips    ROS Review of Systems  Constitutional: Negative.   Respiratory:  Negative for cough.   Cardiovascular:  Negative for chest pain and palpitations.  Skin:  Positive for rash.  Psychiatric/Behavioral: Negative.        Objective:    Physical Exam Constitutional:      Appearance: Normal appearance.  Cardiovascular:      Rate and Rhythm: Normal rate and regular rhythm.     Pulses: Normal pulses.     Heart sounds: Normal heart sounds. No murmur heard.    No friction rub.  Pulmonary:     Effort: Pulmonary effort is normal.     Breath sounds: No stridor. No wheezing.  Abdominal:     General: Bowel sounds are normal.     Palpations: Abdomen is soft.     Tenderness: There is no abdominal tenderness. There is no rebound.  Musculoskeletal:       Arms:       Legs:     Comments: Erythema and tiny blister to right lower leg and lower arm.  Erythema on the left side of neck and face.   Skin:    Findings: Rash present.  Neurological:     General: No focal deficit present.     Mental Status: She is alert and oriented to person, place, and time. Mental status is at baseline.     BP 100/72   Pulse 76   Temp 97.8 F (36.6 C) (Oral)   Ht 5\' 4"  (1.626 m)   Wt 122 lb 3.2 oz (55.4 kg)   LMP 03/03/2020   SpO2 98%   BMI 20.98 kg/m  Wt Readings from Last 3 Encounters:  01/02/23 122 lb 3.2 oz (55.4 kg)  12/26/22 123 lb (55.8 kg)  04/14/22 125 lb 6.4 oz (56.9 kg)     Health Maintenance  Topic Date Due   HIV Screening  Never done   Hepatitis C Screening  Never done   Zoster Vaccines- Shingrix (1 of 2) Never done   COLONOSCOPY (Pts 45-28yrs Insurance coverage will need to be confirmed)  12/25/2021   PAP SMEAR-Modifier  02/03/2023   COVID-19 Vaccine (4 - 2023-24 season) 01/11/2023 (Originally 04/28/2022)   INFLUENZA VACCINE  03/29/2023   MAMMOGRAM  01/26/2024   DTaP/Tdap/Td (3 - Td or Tdap) 04/14/2032   HPV VACCINES  Aged Out    There are no preventive care reminders to display for this patient.  Lab Results  Component Value Date   TSH 0.71 01/17/2021   Lab Results  Component Value Date   WBC 5.5 01/17/2021   HGB 13.3 01/17/2021   HCT 39.7 01/17/2021   MCV 105.2 (H) 01/17/2021   PLT 198.0 01/17/2021   Lab Results  Component Value Date   NA 139 01/17/2021   K 3.8 01/17/2021   CO2 30  01/17/2021   GLUCOSE 93 01/17/2021   BUN 18 01/17/2021   CREATININE 0.84 01/17/2021   BILITOT 0.5 01/17/2021   ALKPHOS 42 01/17/2021   AST 28  01/17/2021   ALT 23 01/17/2021   PROT 6.7 01/17/2021   ALBUMIN 4.2 01/17/2021   CALCIUM 8.8 01/17/2021   GFR 81.11 01/17/2021   Lab Results  Component Value Date   CHOL 202 (H) 01/17/2021   Lab Results  Component Value Date   HDL 64.50 01/17/2021   Lab Results  Component Value Date   LDLCALC 121 (H) 01/17/2021   Lab Results  Component Value Date   TRIG 81.0 01/17/2021   Lab Results  Component Value Date   CHOLHDL 3 01/17/2021   No results found for: "HGBA1C"    Assessment & Plan:  Dermatitis Assessment & Plan: Started on prednisone tapering 40 mg X 2 days, 30 mg X 2 days, 20 mg X 1 day, 10 mg X 1 day. Continue Lotrisone cream. Keep [place] posted about the symptoms. If symptoms not improving call the office or contact your primary dermatologist.   Other orders -     Cetirizine HCl; Take 1 tablet (10 mg total) by mouth daily.  Dispense: 30 tablet; Refill: 1 -     predniSONE; Take 4 tablets ( total 40 mg) by mouth for 2 days; take 3 tablets ( total 30 mg) by mouth for 2 days; take 2 tablets ( total 20 mg) by mouth for 1 day; take 1 tablet ( total 10 mg) by mouth for 1 day.  Dispense: 17 tablet; Refill: 0    Follow-up: Return if symptoms worsen or fail to improve.   Kara Dies, NP

## 2023-01-05 ENCOUNTER — Encounter: Payer: Self-pay | Admitting: Nurse Practitioner

## 2023-01-05 NOTE — Assessment & Plan Note (Signed)
Started on prednisone tapering 40 mg X 2 days, 30 mg X 2 days, 20 mg X 1 day, 10 mg X 1 day. Continue Lotrisone cream. Keep [place] posted about the symptoms. If symptoms not improving call the office or contact your primary dermatologist.

## 2023-01-08 ENCOUNTER — Encounter: Payer: Self-pay | Admitting: Internal Medicine

## 2023-01-08 ENCOUNTER — Other Ambulatory Visit: Payer: Self-pay | Admitting: Internal Medicine

## 2023-01-08 MED ORDER — CELECOXIB 100 MG PO CAPS: 100.0000 mg | ORAL_CAPSULE | Freq: Two times a day (BID) | ORAL | 0 refills | Status: DC

## 2023-01-10 ENCOUNTER — Ambulatory Visit: Payer: No Typology Code available for payment source

## 2023-01-10 DIAGNOSIS — K635 Polyp of colon: Secondary | ICD-10-CM

## 2023-01-10 DIAGNOSIS — K640 First degree hemorrhoids: Secondary | ICD-10-CM

## 2023-01-10 DIAGNOSIS — Z83719 Family history of colon polyps, unspecified: Secondary | ICD-10-CM

## 2023-01-10 DIAGNOSIS — Z1211 Encounter for screening for malignant neoplasm of colon: Secondary | ICD-10-CM | POA: Diagnosis not present

## 2023-01-29 ENCOUNTER — Ambulatory Visit
Admission: RE | Admit: 2023-01-29 | Discharge: 2023-01-29 | Disposition: A | Discharge status: Home / Self Care | Payer: No Typology Code available for payment source | Source: Ambulatory Visit | Attending: Internal Medicine | Admitting: Internal Medicine

## 2023-01-29 ENCOUNTER — Other Ambulatory Visit: Payer: Self-pay | Admitting: Internal Medicine

## 2023-01-29 DIAGNOSIS — Z1231 Encounter for screening mammogram for malignant neoplasm of breast: Secondary | ICD-10-CM | POA: Insufficient documentation

## 2023-06-12 ENCOUNTER — Other Ambulatory Visit: Payer: Self-pay

## 2023-06-12 MED ORDER — CELECOXIB 100 MG PO CAPS: 100.0000 mg | ORAL_CAPSULE | Freq: Two times a day (BID) | ORAL | 0 refills | Status: AC

## 2023-12-17 ENCOUNTER — Encounter: Payer: Self-pay | Admitting: Internal Medicine

## 2023-12-17 DIAGNOSIS — Z1231 Encounter for screening mammogram for malignant neoplasm of breast: Secondary | ICD-10-CM

## 2023-12-27 ENCOUNTER — Other Ambulatory Visit: Payer: Self-pay | Admitting: Certified Nurse Midwife

## 2023-12-27 DIAGNOSIS — N644 Mastodynia: Secondary | ICD-10-CM

## 2024-01-01 ENCOUNTER — Ambulatory Visit
Admission: RE | Admit: 2024-01-01 | Discharge: 2024-01-01 | Disposition: A | Discharge status: Home / Self Care | Source: Ambulatory Visit | Attending: Certified Nurse Midwife | Admitting: Certified Nurse Midwife

## 2024-01-01 DIAGNOSIS — N644 Mastodynia: Secondary | ICD-10-CM | POA: Diagnosis present
